# Patient Record
Sex: Female | Born: 1980 | Race: White | Hispanic: No | Marital: Single | Smoking: Current every day smoker
Health system: Southern US, Community
[De-identification: ages and names within clinical notes are randomized; demographics above are authoritative.]

## PROBLEM LIST (undated history)

## (undated) DIAGNOSIS — F410 Panic disorder [episodic paroxysmal anxiety] without agoraphobia: Secondary | ICD-10-CM

## (undated) DIAGNOSIS — F32A Depression, unspecified: Secondary | ICD-10-CM

## (undated) DIAGNOSIS — K219 Gastro-esophageal reflux disease without esophagitis: Secondary | ICD-10-CM

## (undated) DIAGNOSIS — F1120 Opioid dependence, uncomplicated: Secondary | ICD-10-CM

## (undated) DIAGNOSIS — F329 Major depressive disorder, single episode, unspecified: Secondary | ICD-10-CM

## (undated) HISTORY — PX: TONSILLECTOMY: SUR1361

## (undated) HISTORY — DX: Depression, unspecified: F32.A

## (undated) HISTORY — DX: Gastro-esophageal reflux disease without esophagitis: K21.9

## (undated) HISTORY — DX: Opioid dependence, uncomplicated: F11.20

## (undated) HISTORY — DX: Major depressive disorder, single episode, unspecified: F32.9

## (undated) HISTORY — DX: Panic disorder (episodic paroxysmal anxiety): F41.0

---

## 2008-08-07 ENCOUNTER — Emergency Department (HOSPITAL_COMMUNITY): Admission: EM | Admit: 2008-08-07 | Discharge: 2008-08-07 | Payer: Self-pay | Admitting: Emergency Medicine

## 2009-02-09 ENCOUNTER — Emergency Department (HOSPITAL_COMMUNITY): Admission: EM | Admit: 2009-02-09 | Discharge: 2009-02-09 | Payer: Self-pay | Admitting: Emergency Medicine

## 2009-07-09 ENCOUNTER — Emergency Department (HOSPITAL_COMMUNITY): Admission: EM | Admit: 2009-07-09 | Discharge: 2009-07-09 | Payer: Self-pay | Admitting: Emergency Medicine

## 2009-09-15 ENCOUNTER — Emergency Department (HOSPITAL_COMMUNITY): Admission: EM | Admit: 2009-09-15 | Discharge: 2009-09-15 | Payer: Self-pay | Admitting: Emergency Medicine

## 2009-09-16 ENCOUNTER — Ambulatory Visit (HOSPITAL_COMMUNITY): Admission: RE | Admit: 2009-09-16 | Discharge: 2009-09-16 | Payer: Self-pay | Admitting: Emergency Medicine

## 2010-02-07 ENCOUNTER — Emergency Department (HOSPITAL_COMMUNITY): Admission: EM | Admit: 2010-02-07 | Discharge: 2010-02-07 | Payer: Self-pay | Admitting: Emergency Medicine

## 2010-02-21 ENCOUNTER — Emergency Department (HOSPITAL_COMMUNITY): Admission: EM | Admit: 2010-02-21 | Discharge: 2010-02-21 | Payer: Self-pay | Admitting: Emergency Medicine

## 2010-07-21 ENCOUNTER — Emergency Department (HOSPITAL_COMMUNITY): Admission: EM | Admit: 2010-07-21 | Discharge: 2010-07-21 | Payer: Self-pay | Admitting: Emergency Medicine

## 2011-01-26 LAB — URINALYSIS, ROUTINE W REFLEX MICROSCOPIC
Nitrite: NEGATIVE
Protein, ur: NEGATIVE mg/dL

## 2011-01-26 LAB — HCG, QUANTITATIVE, PREGNANCY: hCG, Beta Chain, Quant, S: 51595 m[IU]/mL — ABNORMAL HIGH (ref <5)

## 2011-02-01 LAB — URINALYSIS, ROUTINE W REFLEX MICROSCOPIC
Glucose, UA: NEGATIVE mg/dL
Ketones, ur: NEGATIVE mg/dL
Nitrite: POSITIVE — AB
Protein, ur: NEGATIVE mg/dL
Urobilinogen, UA: 0.2 mg/dL (ref 0.0–1.0)
pH: 5.5 (ref 5.0–8.0)

## 2011-02-01 LAB — URINE CULTURE: Colony Count: >=100000

## 2011-02-01 LAB — URINE MICROSCOPIC-ADD ON

## 2011-02-01 LAB — PREGNANCY, URINE: Preg Test, Ur: NEGATIVE

## 2011-02-05 LAB — GC/CHLAMYDIA PROBE AMP, GENITAL
Chlamydia, DNA Probe: NEGATIVE
GC Probe Amp, Genital: NEGATIVE

## 2011-02-05 LAB — URINALYSIS, ROUTINE W REFLEX MICROSCOPIC
Bilirubin Urine: NEGATIVE
Ketones, ur: NEGATIVE mg/dL
Protein, ur: NEGATIVE mg/dL

## 2011-02-05 LAB — URINE MICROSCOPIC-ADD ON

## 2011-02-05 LAB — URINE CULTURE

## 2011-02-05 LAB — PREGNANCY, URINE: Preg Test, Ur: NEGATIVE

## 2011-02-05 LAB — WET PREP, GENITAL: Yeast Wet Prep HPF POC: NONE SEEN

## 2011-02-15 LAB — URINALYSIS, ROUTINE W REFLEX MICROSCOPIC
Specific Gravity, Urine: 1.015 (ref 1.005–1.030)
Urobilinogen, UA: 0.2 mg/dL (ref 0.0–1.0)
pH: 7.5 (ref 5.0–8.0)

## 2011-02-15 LAB — WET PREP, GENITAL
Clue Cells Wet Prep HPF POC: NONE SEEN
Trich, Wet Prep: NONE SEEN
Yeast Wet Prep HPF POC: NONE SEEN

## 2011-02-15 LAB — GC/CHLAMYDIA PROBE AMP, GENITAL: Chlamydia, DNA Probe: NEGATIVE

## 2011-02-18 LAB — RAPID STREP SCREEN (MED CTR MEBANE ONLY): Streptococcus, Group A Screen (Direct): NEGATIVE

## 2011-04-10 ENCOUNTER — Emergency Department (HOSPITAL_COMMUNITY): Payer: Medicaid - Out of State

## 2011-04-10 ENCOUNTER — Emergency Department (HOSPITAL_COMMUNITY)
Admission: EM | Admit: 2011-04-10 | Discharge: 2011-04-10 | Disposition: A | Discharge status: Home / Self Care | Payer: Medicaid - Out of State | Attending: Emergency Medicine | Admitting: Emergency Medicine

## 2011-04-10 DIAGNOSIS — R109 Unspecified abdominal pain: Secondary | ICD-10-CM | POA: Insufficient documentation

## 2011-04-10 DIAGNOSIS — Z87442 Personal history of urinary calculi: Secondary | ICD-10-CM | POA: Insufficient documentation

## 2011-04-10 LAB — CBC
HCT: 42.7 % (ref 36.0–46.0)
Hemoglobin: 14.9 g/dL (ref 12.0–15.0)
MCH: 31.7 pg (ref 26.0–34.0)
MCHC: 34.9 g/dL (ref 30.0–36.0)
MCV: 90.9 fL (ref 78.0–100.0)

## 2011-04-10 LAB — URINALYSIS, ROUTINE W REFLEX MICROSCOPIC
Hgb urine dipstick: NEGATIVE
pH: 5.5 (ref 5.0–8.0)

## 2011-04-10 LAB — DIFFERENTIAL
Lymphocytes Relative: 34 % (ref 12–46)
Lymphs Abs: 3.2 10*3/uL (ref 0.7–4.0)
Monocytes Absolute: 0.6 10*3/uL (ref 0.1–1.0)
Monocytes Relative: 6 % (ref 3–12)
Neutro Abs: 5.7 10*3/uL (ref 1.7–7.7)

## 2011-04-10 LAB — BASIC METABOLIC PANEL
BUN: 10 mg/dL (ref 6–23)
CO2: 24 mEq/L (ref 19–32)
Calcium: 10.5 mg/dL (ref 8.4–10.5)
Glucose, Bld: 91 mg/dL (ref 70–99)
Sodium: 136 mEq/L (ref 135–145)

## 2011-11-20 IMAGING — US US OB COMP LESS 14 WK
1 series · 13 of 28 positions shown · non-contrast
Comparison: None.

07/22/2010 - DUPLICATE COPY for exam association in [facility] – No change from original report.
CLINICAL DATA: Pregnant with abdominal pain. Rule out ectopic
 gestation

 OBSTETRIC <14 WK US AND TRANSVAGINAL OB US
TECHNIQUE: Both transabdominal and transvaginal ultrasound
 examinations were performed for complete evaluation of the
 gestation as well as the maternal uterus, adnexal regions, and
 pelvic cul-de-sac. Transvaginal technique was performed to assess
 early pregnancy.

[Series 1: us ob comp less 14 wk · 0.23mm/px · 13 of 99 slices shown]
[im 4/99]
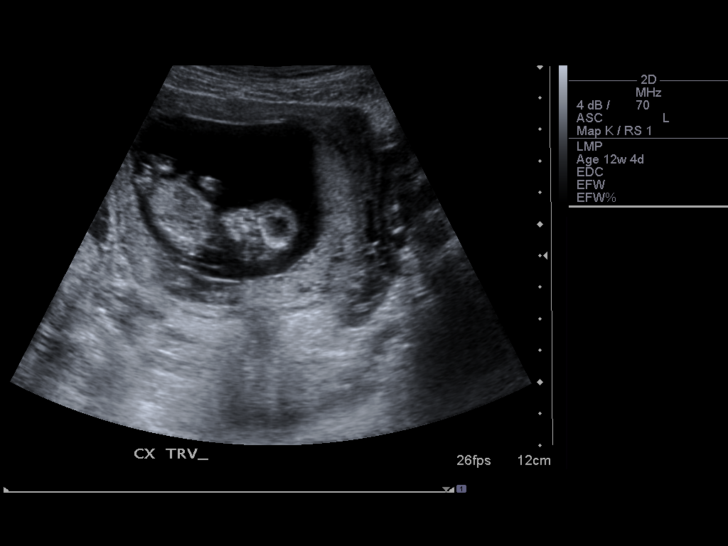
[im 11/99]
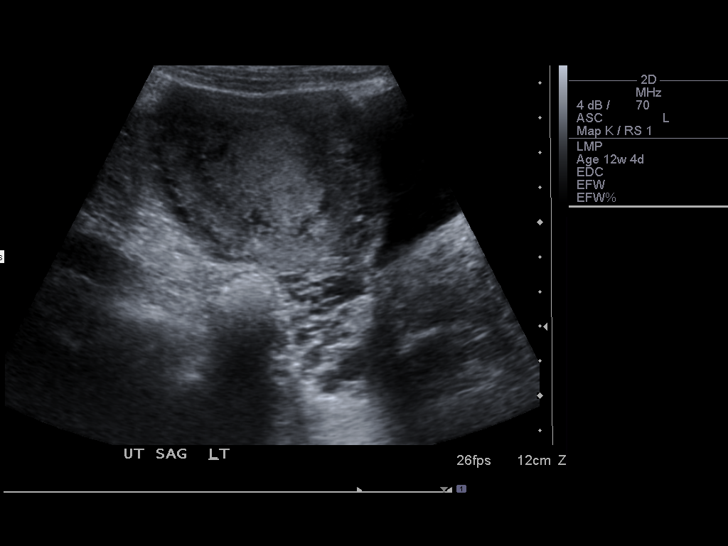
[im 19/99]
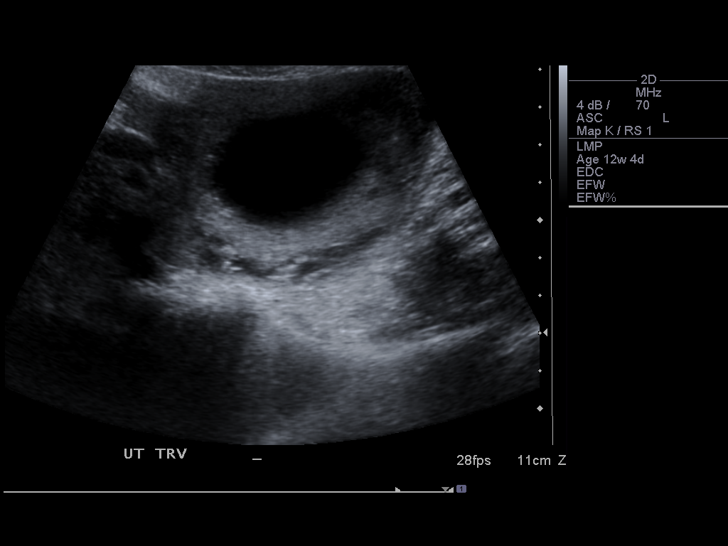
[im 26/99]
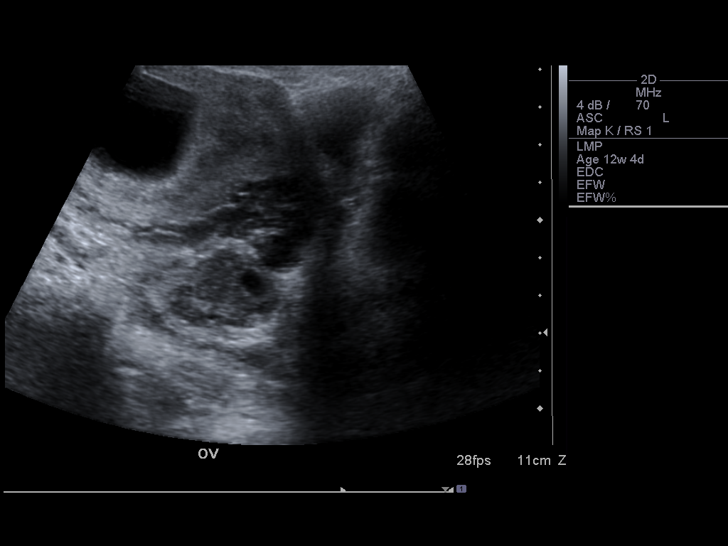
[im 33/99]
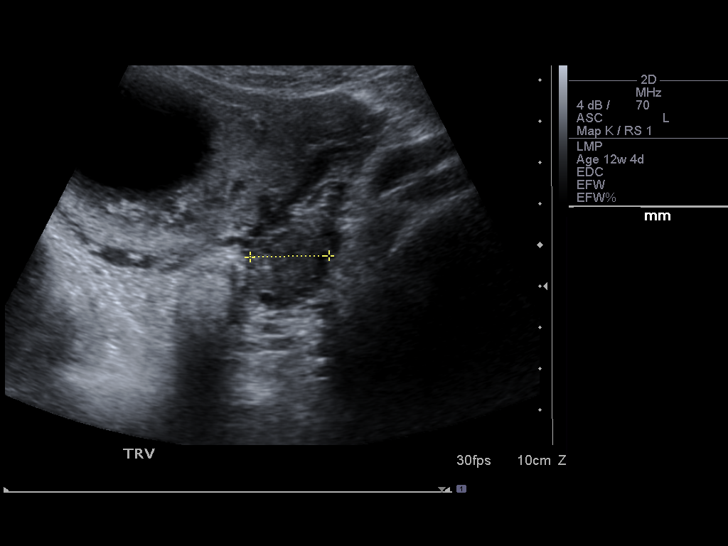
[im 40/99]
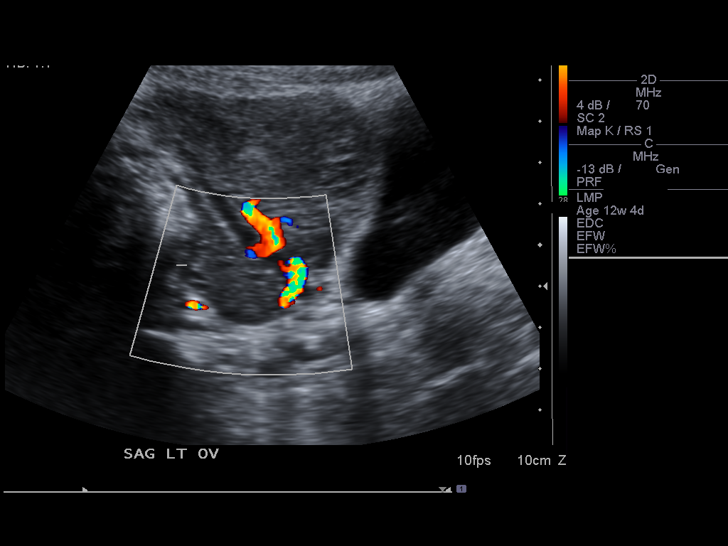
[im 51/99]
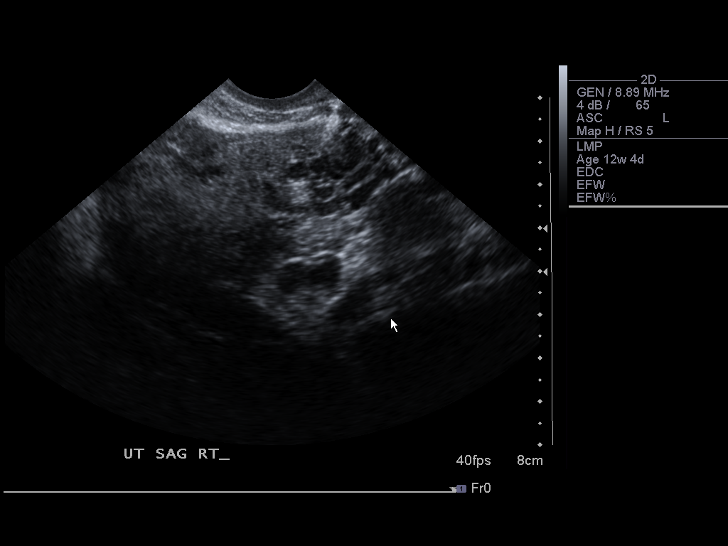
[im 59/99]
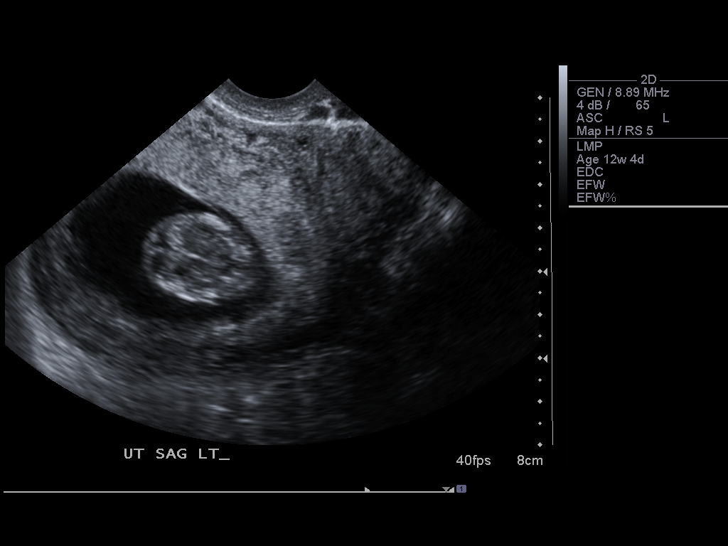
[im 66/99]
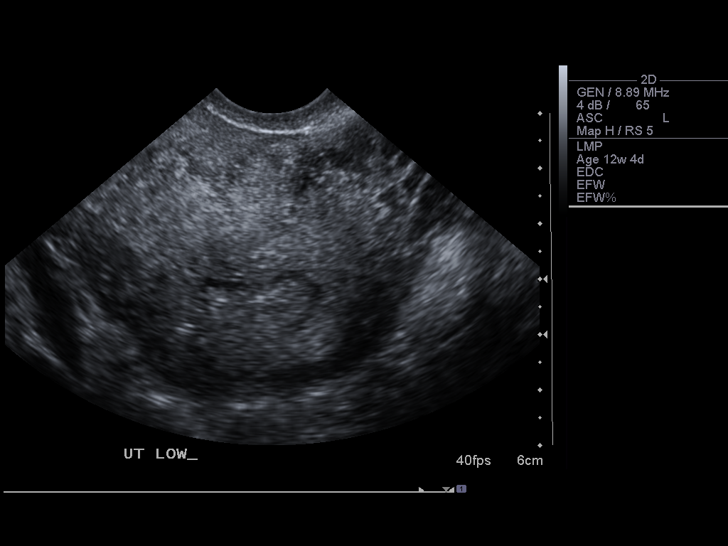
[im 73/99]
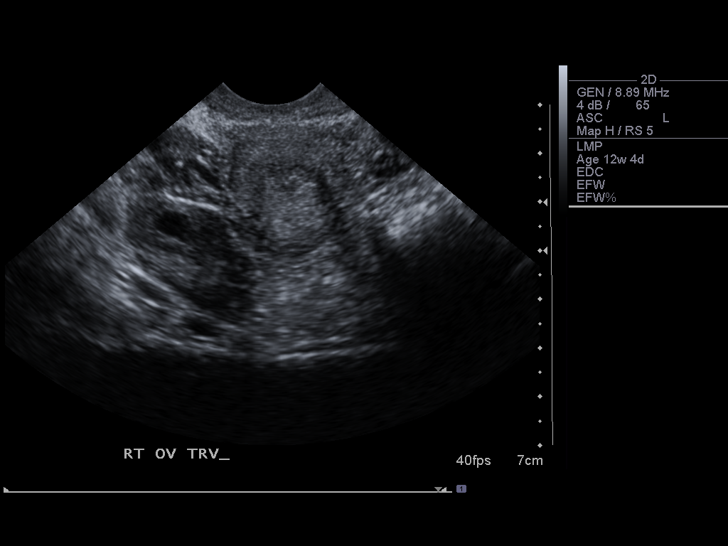
[im 80/99]
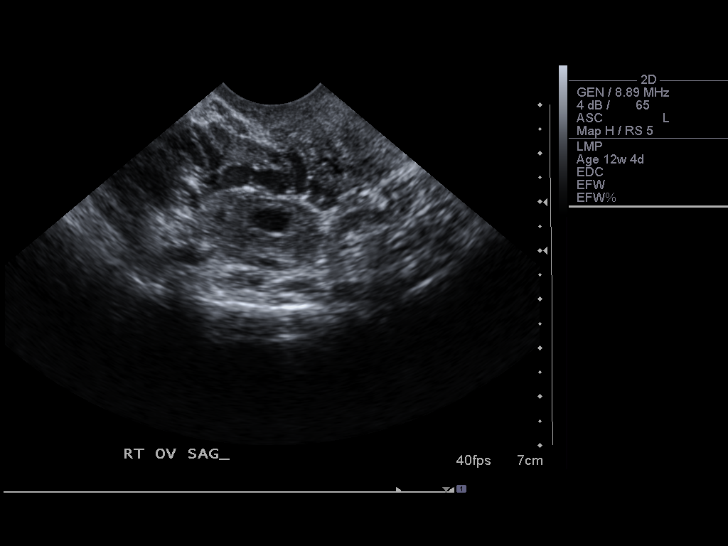
[im 88/99]
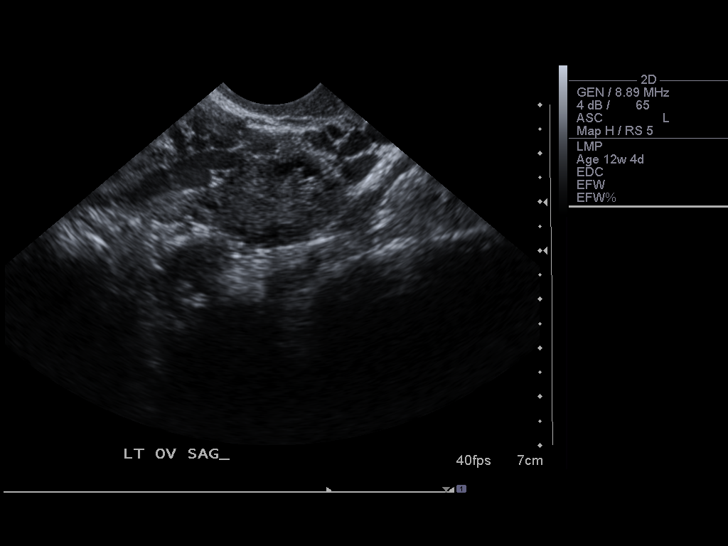
[im 95/99]
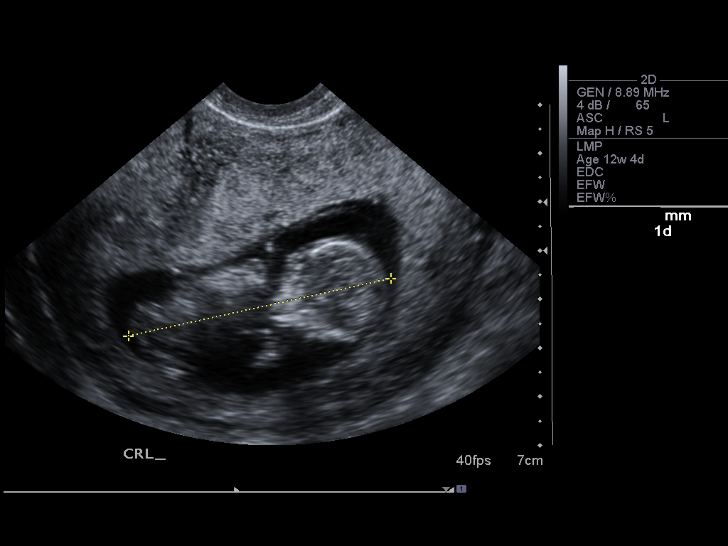

[13 of 28 positions shown; findings below may reference images not displayed]

Intrauterine gestational sac: Visualized/normal in shape.
 Yolk sac: Not seen
 Embryo: Seen
 Cardiac Activity: Seen
 Heart Rate: 169 bpm

 MSD: mm w d
 CRL: 5.5 mm 12 w 1 d US EDC: 02/02/2011

 Maternal uterus/adnexae:
 The right ovary appears normal measuring 2.2 x 1.2 1 x 2.3 cm. The
 left ovary appears normal measuring 2.1 x 2.9 x 1.6 cm. No pelvic
 fluid or separate adnexal masses are seen.

 Currently the placental implantation site completely covers the
 internal cervical os asymmetrically.
IMPRESSION: Single living intrauterine pregnancy demonstrating an estimated
 gestational age by ultrasound 12 weeks 1 day. This correlates well
 with assigned gestational age by LMP of 12 weeks 4 days and
 corresponding EDC of 01/29/2011.

 Normal ovaries.

 Current asymmetric complete coverage of the internal cervical os by
 the placental implantation site. Placental position can be
 reassessed at the time of anatomic evaluation.

## 2011-12-30 ENCOUNTER — Emergency Department (HOSPITAL_COMMUNITY)
Admission: EM | Admit: 2011-12-30 | Discharge: 2011-12-31 | Disposition: A | Discharge status: Home / Self Care | Payer: Medicaid - Out of State | Attending: Emergency Medicine | Admitting: Emergency Medicine

## 2011-12-30 ENCOUNTER — Encounter (HOSPITAL_COMMUNITY): Payer: Self-pay | Admitting: *Deleted

## 2011-12-30 DIAGNOSIS — K089 Disorder of teeth and supporting structures, unspecified: Secondary | ICD-10-CM | POA: Insufficient documentation

## 2011-12-30 DIAGNOSIS — N898 Other specified noninflammatory disorders of vagina: Secondary | ICD-10-CM | POA: Insufficient documentation

## 2011-12-30 DIAGNOSIS — K0889 Other specified disorders of teeth and supporting structures: Secondary | ICD-10-CM

## 2011-12-30 DIAGNOSIS — K1370 Unspecified lesions of oral mucosa: Secondary | ICD-10-CM | POA: Insufficient documentation

## 2011-12-30 LAB — URINALYSIS, ROUTINE W REFLEX MICROSCOPIC
Bilirubin Urine: NEGATIVE
Hgb urine dipstick: NEGATIVE
Specific Gravity, Urine: 1.02 (ref 1.005–1.030)
Urobilinogen, UA: 0.2 mg/dL (ref 0.0–1.0)

## 2011-12-30 LAB — WET PREP, GENITAL
Trich, Wet Prep: NONE SEEN
Yeast Wet Prep HPF POC: NONE SEEN

## 2011-12-30 MED ORDER — OXYCODONE-ACETAMINOPHEN 5-325 MG PO TABS
1.0000 | ORAL_TABLET | Freq: Once | ORAL | Status: AC
  Administered 2011-12-30: 1 via ORAL
  Filled 2011-12-30: qty 1

## 2011-12-30 MED ORDER — IBUPROFEN 800 MG PO TABS
800.0000 mg | ORAL_TABLET | Freq: Once | ORAL | Status: AC
  Administered 2011-12-30: 800 mg via ORAL
  Filled 2011-12-30: qty 1

## 2011-12-30 MED ORDER — PENICILLIN V POTASSIUM 500 MG PO TABS: 500.0000 mg | ORAL_TABLET | Freq: Four times a day (QID) | ORAL | Status: AC

## 2011-12-30 NOTE — ED Provider Notes (Signed)
History     CSN: [id number]  Arrival date & time 12/30/11  2209   First MD Initiated Contact with Patient 12/30/11 2253      Chief Complaint  Patient presents with  . Dental Pain  . Vaginal Discharge     Patient is a 31 y.o. female presenting with tooth pain and vaginal discharge. The history is provided by the patient.  Dental PainThe primary symptoms include mouth pain. The symptoms began 12 to 24 hours ago. The symptoms are worsening. The symptoms are new. The symptoms occur constantly.  Additional symptoms do not include: drooling.   Vaginal Discharge This is a new problem. The problem occurs hourly. The problem has not changed since onset.Pertinent negatives include no abdominal pain. The symptoms are aggravated by nothing. The symptoms are relieved by nothing.  pt denies fever/vomiting/vag bleeding   PMH - none  History reviewed. No pertinent past surgical history.  History reviewed. No pertinent family history.  History  Substance Use Topics  . Smoking status: Not on file  . Smokeless tobacco: Not on file  . Alcohol Use: Not on file    OB History    Grav Para Term Preterm Abortions TAB SAB Ect Mult Living                  Review of Systems  HENT: Negative for drooling.   Gastrointestinal: Negative for abdominal pain.  Genitourinary: Positive for vaginal discharge.    Allergies  Review of patient's allergies indicates no known allergies.  Home Medications  No current outpatient prescriptions on file.  BP 109/58  Pulse 84  Temp 98 F (36.7 C)  Resp 20  Ht 5' (1.524 m)  Wt 99 lb (44.906 kg)  BMI 19.33 kg/m2  SpO2 99%  Physical Exam CONSTITUTIONAL: Well developed/well nourished HEAD AND FACE: Normocephalic/atraumatic EYES: EOMI/PERRL ENMT: Mucous membranes moist.  No trismus.  Tender to left lower molar but no abscess noted NECK: supple no meningeal signs SPINE:entire spine nontender CV: S1/S2 noted, no murmurs/rubs/gallops noted LUNGS:  Lungs are clear to auscultation bilaterally, no apparent distress ABDOMEN: soft, nontender, no rebound or guarding GU:no cva tenderness Chaperone present - no vaginal discharge/bleeding noted, no CMT, no adnexal tenderness noted NEURO: Pt is awake/alert, moves all extremitiesx4 EXTREMITIES: pulses normal, full ROM SKIN: warm, color normal PSYCH: no abnormalities of mood noted  ED Course  Procedures   Labs Reviewed  URINALYSIS, ROUTINE W REFLEX MICROSCOPIC  PREGNANCY, URINE  GC/CHLAMYDIA PROBE AMP, GENITAL  WET PREP, GENITAL      MDM  Nursing notes reviewed and considered in documentation All labs/vitals reviewed and considered         Joya Gaskins, MD 12/31/11 0003

## 2011-12-30 NOTE — ED Notes (Addendum)
Pt c/o pain to a tooth on the left bottom side of her mouth. Pt also c/o yellow vaginal discharge and odor.

## 2011-12-30 NOTE — Discharge Instructions (Signed)
Dental Pain     Toothache is pain in or around a tooth. It may get worse with chewing or with cold or heat.   HOME CARE  · Your dentist may use a numbing medicine during treatment. If so, you may need to avoid eating until the medicine wears off. Ask your dentist about this.   · Only take medicine as told by your dentist or doctor.   · Avoid chewing food near the painful tooth until after all treatment is done. Ask your dentist about this.   GET HELP RIGHT AWAY IF:   · The problem gets worse or new problems appear.   · You have a fever.   · There is redness and puffiness (swelling) of the face, jaw, or neck.   · You cannot open your mouth.   · There is pain in the jaw.   · There is very bad pain that is not helped by medicine.   MAKE SURE YOU:   · Understand these instructions.   · Will watch your condition.   · Will get help right away if you are not doing well or get worse.   Document Released: 04/17/2008 Document Revised: 07/12/2011 Document Reviewed: 04/17/2008  [employer]® Patient Information ©2012 [employer].

## 2012-01-01 LAB — GC/CHLAMYDIA PROBE AMP, GENITAL
Chlamydia, DNA Probe: NEGATIVE
GC Probe Amp, Genital: NEGATIVE

## 2012-02-13 ENCOUNTER — Emergency Department (HOSPITAL_COMMUNITY)
Admission: EM | Admit: 2012-02-13 | Discharge: 2012-02-13 | Disposition: A | Discharge status: Home / Self Care | Payer: Self-pay | Attending: Emergency Medicine | Admitting: Emergency Medicine

## 2012-02-13 ENCOUNTER — Encounter (HOSPITAL_COMMUNITY): Payer: Self-pay | Admitting: Emergency Medicine

## 2012-02-13 DIAGNOSIS — F17200 Nicotine dependence, unspecified, uncomplicated: Secondary | ICD-10-CM | POA: Insufficient documentation

## 2012-02-13 DIAGNOSIS — J029 Acute pharyngitis, unspecified: Secondary | ICD-10-CM | POA: Insufficient documentation

## 2012-02-13 LAB — MONONUCLEOSIS SCREEN: Mono Screen: NEGATIVE

## 2012-02-13 NOTE — ED Notes (Signed)
MD at bedside to discuss plan of care

## 2012-02-13 NOTE — ED Notes (Signed)
Pt with sore throat, general body aches and ear aches since sunday

## 2012-02-13 NOTE — ED Notes (Signed)
Into room to assess patient. States she has had a sore throat and body aches x 3 days. States her throat feels like it is closing. No respiratory distress. Throat assessed; red with no blisters or drainage. Denies needs. Call bell within reach. Awaiting to be seen.

## 2012-02-13 NOTE — ED Notes (Signed)
Lab at bedside to draw blood.

## 2012-02-13 NOTE — ED Notes (Signed)
Resting sitting up in bed. No distress. Equal chest rise and fall, regular, unlabored. Call bell within reach. Denies needs.

## 2012-02-13 NOTE — Discharge Instructions (Signed)
Caitlyn Cummings, you had physical examination, a rapid strep test, and a test for infectious mononucleosis to check on you for a sore throat.  Your strep test and mono test were both negative.  This means that your sore throat is caused by a virus infection.  Virus infections do not respond to antibiotic medicine.  Treatment for viral sore throat consists of gargling with warm salt water and taking Tylenol or Motrin if needed for fever.  This illness should get better over the next three to five days.Pharyngitis, Viral and Bacterial Pharyngitis is soreness (inflammation) or infection of the pharynx. It is also called a sore throat. CAUSES  Most sore throats are caused by viruses and are part of a cold. However, some sore throats are caused by strep and other bacteria. Sore throats can also be caused by post nasal drip from draining sinuses, allergies and sometimes from sleeping with an open mouth. Infectious sore throats can be spread from person to person by coughing, sneezing and sharing cups or eating utensils. TREATMENT  Sore throats that are viral usually last 3-4 days. Viral illness will get better without medications (antibiotics). Strep throat and other bacterial infections will usually begin to get better about 24-48 hours after you begin to take antibiotics. HOME CARE INSTRUCTIONS   If the caregiver feels there is a bacterial infection or if there is a positive strep test, they will prescribe an antibiotic. The full course of antibiotics must be taken. If the full course of antibiotic is not taken, you or your child may become ill again. If you or your child has strep throat and do not finish all of the medication, serious heart or kidney diseases may develop.   Drink enough water and fluids to keep your urine clear or pale yellow.   Only take over-the-counter or prescription medicines for pain, discomfort or fever as directed by your caregiver.   Get lots of rest.   Gargle with salt water ( tsp.  of salt in a glass of water) as often as every 1-2 hours as you need for comfort.   Hard candies may soothe the throat if individual is not at risk for choking. Throat sprays or lozenges may also be used.  SEEK MEDICAL CARE IF:   Large, tender lumps in the neck develop.   A rash develops.   Green, yellow-brown or bloody sputum is coughed up.   Your baby is older than 3 months with a rectal temperature of 100.5 F (38.1 C) or higher for more than 1 day.  SEEK IMMEDIATE MEDICAL CARE IF:   A stiff neck develops.   You or your child are drooling or unable to swallow liquids.   You or your child are vomiting, unable to keep medications or liquids down.   You or your child has severe pain, unrelieved with recommended medications.   You or your child are having difficulty breathing (not due to stuffy nose).   You or your child are unable to fully open your mouth.   You or your child develop redness, swelling, or severe pain anywhere on the neck.   You have a fever.   Your baby is older than 3 months with a rectal temperature of 102 F (38.9 C) or higher.   Your baby is 89 months old or younger with a rectal temperature of 100.4 F (38 C) or higher.  MAKE SURE YOU:   Understand these instructions.   Will watch your condition.   Will get help  right away if you are not doing well or get worse.  Document Released: 10/30/2005 Document Revised: 10/19/2011 Document Reviewed: 01/27/2008 [facility] Patient Information 2012 [place], [place].

## 2012-02-13 NOTE — ED Notes (Signed)
Patient resting sitting up in bed. No distress. Denies any needs. Call bell within reach. Will continue to monitor.

## 2012-02-13 NOTE — ED Provider Notes (Signed)
History     CSN: [id number]  Arrival date & time 02/13/12  1953   First MD Initiated Contact with Patient 02/13/12 2017      Chief Complaint  Patient presents with  . Sore Throat  . Otalgia  . Generalized Body Aches    (Consider location/radiation/quality/duration/timing/severity/associated sxs/prior treatment) HPI Comments: Patient is a 31 year old woman who developed sore throat 2 days ago. She had body aches. Our ears are hurting. She had temperature to 102.7. She's been taking Tylenol and Motrin for pain and fever. She therefore seeks evaluation.  Patient is a 31 y.o. female presenting with pharyngitis and ear pain. The history is provided by the patient. No language interpreter was used.  Sore Throat This is a new problem. The current episode started 2 days ago. The problem occurs constantly. The problem has been gradually worsening. Associated symptoms comments: Bilateral earache.. The symptoms are aggravated by nothing. The symptoms are relieved by medications and acetaminophen. She has tried acetaminophen (ibuprofen) for the symptoms. Improvement on treatment: transient relief.  Otalgia Associated symptoms include sore throat.    History reviewed. No pertinent past medical history.  History reviewed. No pertinent past surgical history.  No family history on file.  History  Substance Use Topics  . Smoking status: Current Everyday Smoker -- 1.0 packs/day for 13 years    Types: Cigarettes  . Smokeless tobacco: Not on file  . Alcohol Use: No    OB History    Grav Para Term Preterm Abortions TAB SAB Ect Mult Living                  Review of Systems  Constitutional: Positive for fever and chills.  HENT: Positive for ear pain and sore throat.   Eyes: Negative.   Respiratory: Negative.   Cardiovascular: Negative.   Gastrointestinal: Negative.   Genitourinary: Negative.   Musculoskeletal: Positive for myalgias.  Skin: Negative.   Neurological: Negative.     Hematological: Positive for adenopathy.    Allergies  Review of patient's allergies indicates no known allergies.  Home Medications  No current outpatient prescriptions on file.  BP 99/57  Pulse 108  Temp(Src) 98.1 F (36.7 C) (Oral)  Resp 20  Ht 5' (1.524 m)  Wt 102 lb (46.267 kg)  BMI 19.92 kg/m2  SpO2 100%  LMP 02/13/2012  Physical Exam  Nursing note and vitals reviewed. Constitutional: She is oriented to person, place, and time. She appears well-developed and well-nourished. No distress.       Young woman with a squeaky voice.  HENT:  Head: Normocephalic and atraumatic.  Right Ear: External ear normal.  Left Ear: External ear normal.       Throat is red, but without exudate.  Eyes: Conjunctivae and EOM are normal. Pupils are equal, round, and reactive to light.  Neck: Normal range of motion.  Cardiovascular: Normal rate, regular rhythm and normal heart sounds.   Pulmonary/Chest: Effort normal and breath sounds normal.  Abdominal: Soft.  Musculoskeletal: Normal range of motion.  Lymphadenopathy:    Cervical adenopathy: prominent right cervical adenopathy.  Neurological: She is alert and oriented to person, place, and time.       No sensory or motor deficit  Skin: Skin is warm and dry. No rash noted.  Psychiatric: She has a normal mood and affect. Her behavior is normal.    ED Course  Procedures (including critical care time)   Labs Reviewed  RAPID STREP SCREEN  MONONUCLEOSIS SCREEN   8:39 PM  Patient was seen and had physical examination. Rapid strep test and infectious mono screening test were ordered.  9:50 PM Rapid strep and mono test were both negative.  Advised symptomatic treatment with Tylenol, saline gargles.  1. Viral pharyngitis         Carleene Cooper III, MD 02/13/12 2155

## 2012-02-13 NOTE — ED Notes (Signed)
MD at bedside to evaluate.

## 2015-04-12 ENCOUNTER — Encounter (HOSPITAL_COMMUNITY): Payer: Self-pay | Admitting: Emergency Medicine

## 2015-04-12 ENCOUNTER — Emergency Department (HOSPITAL_COMMUNITY)
Admission: EM | Admit: 2015-04-12 | Discharge: 2015-04-12 | Disposition: A | Discharge status: Home / Self Care | Payer: Medicaid - Out of State | Attending: Emergency Medicine | Admitting: Emergency Medicine

## 2015-04-12 DIAGNOSIS — Z8742 Personal history of other diseases of the female genital tract: Secondary | ICD-10-CM | POA: Insufficient documentation

## 2015-04-12 DIAGNOSIS — Z720 Tobacco use: Secondary | ICD-10-CM | POA: Diagnosis not present

## 2015-04-12 DIAGNOSIS — Z87440 Personal history of urinary (tract) infections: Secondary | ICD-10-CM | POA: Diagnosis not present

## 2015-04-12 DIAGNOSIS — Z792 Long term (current) use of antibiotics: Secondary | ICD-10-CM | POA: Diagnosis not present

## 2015-04-12 DIAGNOSIS — R1030 Lower abdominal pain, unspecified: Secondary | ICD-10-CM

## 2015-04-12 DIAGNOSIS — A599 Trichomoniasis, unspecified: Secondary | ICD-10-CM | POA: Insufficient documentation

## 2015-04-12 DIAGNOSIS — R1032 Left lower quadrant pain: Secondary | ICD-10-CM | POA: Diagnosis present

## 2015-04-12 LAB — URINALYSIS, ROUTINE W REFLEX MICROSCOPIC
Bilirubin Urine: NEGATIVE
GLUCOSE, UA: NEGATIVE mg/dL
Ketones, ur: NEGATIVE mg/dL
NITRITE: NEGATIVE
PH: 5.5 (ref 5.0–8.0)
Protein, ur: NEGATIVE mg/dL
Urobilinogen, UA: 0.2 mg/dL (ref 0.0–1.0)

## 2015-04-12 LAB — URINE MICROSCOPIC-ADD ON

## 2015-04-12 MED ORDER — NAPROXEN 500 MG PO TABS: 500.0000 mg | ORAL_TABLET | Freq: Two times a day (BID) | ORAL | Status: DC

## 2015-04-12 MED ORDER — KETOROLAC TROMETHAMINE 60 MG/2ML IM SOLN
INTRAMUSCULAR | Status: AC
  Administered 2015-04-12: 60 mg via INTRAMUSCULAR
  Filled 2015-04-12: qty 2

## 2015-04-12 MED ORDER — METRONIDAZOLE 500 MG PO TABS
2000.0000 mg | ORAL_TABLET | Freq: Once | ORAL | Status: AC
  Administered 2015-04-12: 2000 mg via ORAL
  Filled 2015-04-12: qty 4

## 2015-04-12 MED ORDER — KETOROLAC TROMETHAMINE 60 MG/2ML IM SOLN
60.0000 mg | Freq: Once | INTRAMUSCULAR | Status: AC
  Administered 2015-04-12: 60 mg via INTRAMUSCULAR

## 2015-04-12 NOTE — ED Provider Notes (Signed)
CSN: [id number]     Arrival date & time 04/12/15  [id number] History   First MD Initiated Contact with Patient 04/12/15 1451     Chief Complaint  Patient presents with  . Abdominal Pain     (Consider location/radiation/quality/duration/timing/severity/associated sxs/prior Treatment) HPI Comments: 34 y/o female - has had pain for 10 days in the lower abdomen - this is located in the LLQ - it is constant but varies in intensity, it radiates to the lower back occasionallyi - no dysuria, no hematuria and no vaginal discharge or bleeding.  She had an abortion 6 weeks ago - had no pain and no bleeding afterwards - 10 days after procedure had well check at the health dept and after pelvic exam she was told that everything was normal - she has had no new partners in 3 months and no sexuarl activity during same time frame - then 2 weeks after that developed some pain and was seen in the ED in [place] - describes having a pelvic exam and an US and told she had a UTI and an ovarian cyst.  This pain was treated with Keflex and Hydrocodone - improved temporarily but still having daily pain now 1 week later.  She has no f/c/n/v and no diarrhea.    Patient is a 34 y.o. female presenting with abdominal pain. The history is provided by the patient.  Abdominal Pain   History reviewed. No pertinent past medical history. Past Surgical History  Procedure Laterality Date  . Tonsillectomy     History reviewed. No pertinent family history. History  Substance Use Topics  . Smoking status: Current Every Day Smoker -- 1.00 packs/day for 13 years    Types: Cigarettes  . Smokeless tobacco: Not on file  . Alcohol Use: No   OB History    Gravida Para Term Preterm AB TAB SAB Ectopic Multiple Living   4 3   1   0   3     Review of Systems  Gastrointestinal: Positive for abdominal pain.  All other systems reviewed and are negative.     Allergies  Percocet  Home Medications   Prior to Admission medications    Medication Sig Start Date End Date Taking? Authorizing Provider  cephALEXin (KEFLEX) 500 MG capsule Take 500 mg by mouth 3 (three) times daily. 7 DAY COURSE FILLED 04/05/15   Yes Historical Provider, MD  naproxen (NAPROSYN) 500 MG tablet Take 1 tablet (500 mg total) by mouth 2 (two) times daily with a meal. 04/12/15   Eber HongBrian Korry Dalgleish, MD   BP 109/57 mmHg  Pulse 69  Temp(Src) 98.1 F (36.7 C)  Resp 16  Ht 4\' 11"  (1.499 m)  Wt 115 lb 1.6 oz (52.209 kg)  BMI 23.23 kg/m2  SpO2 98%  LMP 01/11/2015 Physical Exam  Constitutional: She appears well-developed and well-nourished. No distress.  HENT:  Head: Normocephalic and atraumatic.  Mouth/Throat: Oropharynx is clear and moist. No oropharyngeal exudate.  Eyes: Conjunctivae and EOM are normal. Pupils are equal, round, and reactive to light. Right eye exhibits no discharge. Left eye exhibits no discharge. No scleral icterus.  Neck: Normal range of motion. Neck supple. No JVD present. No thyromegaly present.  Cardiovascular: Normal rate, regular rhythm, normal heart sounds and intact distal pulses.  Exam reveals no gallop and no friction rub.   No murmur heard. Pulmonary/Chest: Effort normal and breath sounds normal. No respiratory distress. She has no wheezes. She has no rales.  Abdominal: Soft. Bowel sounds are normal. She  exhibits no distension and no mass. There is tenderness (LLQ ttp, soft - no other ttp. ).  Musculoskeletal: Normal range of motion. She exhibits no edema or tenderness.  Lymphadenopathy:    She has no cervical adenopathy.  Neurological: She is alert. Coordination normal.  Skin: Skin is warm and dry. No rash noted. No erythema.  Psychiatric: She has a normal mood and affect. Her behavior is normal.  Nursing note and vitals reviewed.   ED Course  Procedures (including critical care time) Labs Review Labs Reviewed  URINALYSIS, ROUTINE W REFLEX MICROSCOPIC (NOT AT [facility]) - Abnormal; Notable for the following:    APPearance  HAZY (*)    Specific Gravity, Urine <1.005 (*)    Hgb urine dipstick TRACE (*)    Leukocytes, UA MODERATE (*)    All other components within normal limits  URINE MICROSCOPIC-ADD ON - Abnormal; Notable for the following:    Squamous Epithelial / LPF MANY (*)    Bacteria, UA FEW (*)    All other components within normal limits    Imaging Review No results found.    MDM   Final diagnoses:  Trichomoniasis  Lower abdominal pain    Benign appearance - VS without acute findings - will obtain records to limit repeat studies but does need repeat Urinalysis.    Filed Vitals:   04/12/15 1449  BP: 103/59  Pulse: 88  Temp: 98.6 F (37 C)  Resp: 18     Labs show trich, no other findings - UA improving, VS normal, stable for d/c.  Pt informed and expresses understanding.  Eber Hong, MD 04/12/15 [phone]

## 2015-04-12 NOTE — ED Notes (Addendum)
Pt states that [facility] a few weeks ago and dx with pelvic infection and UTI.  States that her pain is no better after finishing Keflex.  Had elective abortion in April for 10wk pregnancy.

## 2015-04-12 NOTE — ED Notes (Signed)
Patient with no complaints at this time. Respirations even and unlabored. Skin warm/dry. Discharge instructions reviewed with patient at this time. Patient given opportunity to voice concerns/ask questions. Patient discharged at this time and left Emergency Department with steady gait.   

## 2015-04-12 NOTE — Discharge Instructions (Signed)
You have tested positive for Trichomonas - this is an STD  - please see reading below.  Pelvic Infection  If you have been diagnosed with a pelvic infection such as a sexually transmitted disease, you will need to be treated with antibiotics. Please take the medicines as prescribed. Some of these tests do not come back for 1-2 days in which case if they turn positive you will receive a phone call to let you know. If you are contacted and do have an infection consistent with a sexually transmitted disease, then you will need to tell any and all sexual partners that you have had in the last 6 months no so that they can be tested and treated as well. If you should develop severe or worsening pain in your abdomen or the pelvis or develop severe fevers,nausea or vomiting that prevent you from taking your medications, return to the emergency department immediately. Otherwise contact your local physician or county health department for a follow up appointment to complete STD testing including HIV and syphilis.  See the list of phone numbers below.  RESOURCE GUIDE  Dental Problems  Patients with Medicaid: [facility] [phone] [address]                                           [phone] [address] Phone:  [phone]                                                  Phone:  [phone]  If unable to pay or uninsured, contact:  [facility] or [facility]. to become qualified for the adult dental clinic.  Chronic Pain Problems Contact [facility]  [phone] Patients need to be referred by their primary care doctor.  Insufficient Money for Medicine Contact [employer]:  call "211" or [facility] 916-788-0353.  No Primary Care Doctor Call [facility]  [phone] Other agencies that provide inexpensive medical care    [facility]  [phone]    [facility]  [phone]    [facility]  [phone]    [facility]  [phone]    [facility]  [phone]    [facility]  [phone]  Psychological Services [facility] Behavioral Health  [phone] [facility]  [phone] [facility] Mental Health   [phone] (emergency services [phone])  Substance Abuse Resources Alcohol and Drug Services  [phone] [facility] 902-188-3535 [facility] [place] [phone] Floydene Flock [phone] Residential & Outpatient Substance Abuse Program  [phone]  Abuse/Neglect [facility] Child Abuse Hotline 4305025359 [facility] Child Abuse Hotline 850-511-9231 (After Hours)  Emergency Shelter [facility] (856)[phone]  Maternity Homes Room at the [place] [phone] [facility] 5306183344  MRSA Hotline #:   (418) 700-3281    [facility] [place]  [facility] [address] [place]                       [address]      [address]  [place]                                                [place]                            [place] Phone:  [phone]                                   Phone:  [phone]                 Phone:  [phone]  [facility] Phone:  [phone]  [facility] Child Abuse Hotline (979)278-2443(336) [phone] [phone](336) [phone] (After Hours)

## 2016-07-06 ENCOUNTER — Emergency Department (HOSPITAL_COMMUNITY)
Admission: EM | Admit: 2016-07-06 | Discharge: 2016-07-06 | Disposition: A | Discharge status: Home / Self Care | Payer: Medicaid Other | Attending: Emergency Medicine | Admitting: Emergency Medicine

## 2016-07-06 ENCOUNTER — Encounter (HOSPITAL_COMMUNITY): Payer: Self-pay | Admitting: Emergency Medicine

## 2016-07-06 ENCOUNTER — Emergency Department (HOSPITAL_COMMUNITY): Payer: Medicaid Other

## 2016-07-06 DIAGNOSIS — R079 Chest pain, unspecified: Secondary | ICD-10-CM | POA: Insufficient documentation

## 2016-07-06 DIAGNOSIS — B9689 Other specified bacterial agents as the cause of diseases classified elsewhere: Secondary | ICD-10-CM

## 2016-07-06 DIAGNOSIS — F17210 Nicotine dependence, cigarettes, uncomplicated: Secondary | ICD-10-CM | POA: Insufficient documentation

## 2016-07-06 DIAGNOSIS — R1010 Upper abdominal pain, unspecified: Secondary | ICD-10-CM | POA: Diagnosis present

## 2016-07-06 DIAGNOSIS — N760 Acute vaginitis: Secondary | ICD-10-CM | POA: Diagnosis not present

## 2016-07-06 LAB — CBC
HCT: 39.5 % (ref 36.0–46.0)
Hemoglobin: 13.8 g/dL (ref 12.0–15.0)
MCH: 31.4 pg (ref 26.0–34.0)
MCHC: 34.9 g/dL (ref 30.0–36.0)
MCV: 89.8 fL (ref 78.0–100.0)
Platelets: 320 10*3/uL (ref 150–400)
RBC: 4.4 MIL/uL (ref 3.87–5.11)
RDW: 13.5 % (ref 11.5–15.5)
WBC: 10.5 10*3/uL (ref 4.0–10.5)

## 2016-07-06 LAB — COMPREHENSIVE METABOLIC PANEL
ALT: 15 U/L (ref 14–54)
AST: 20 U/L (ref 15–41)
Albumin: 4.6 g/dL (ref 3.5–5.0)
Alkaline Phosphatase: 73 U/L (ref 38–126)
Anion gap: 8 (ref 5–15)
BUN: 9 mg/dL (ref 6–20)
CO2: 25 mmol/L (ref 22–32)
Calcium: 9.1 mg/dL (ref 8.9–10.3)
Chloride: 104 mmol/L (ref 101–111)
Creatinine, Ser: 0.51 mg/dL (ref 0.44–1.00)
GFR calc Af Amer: >60 mL/min (ref >60)
GFR calc non Af Amer: >60 mL/min (ref >60)
Glucose, Bld: 89 mg/dL (ref 65–99)
Potassium: 3.6 mmol/L (ref 3.5–5.1)
Sodium: 137 mmol/L (ref 135–145)
Total Bilirubin: 0.4 mg/dL (ref 0.3–1.2)
Total Protein: 7.3 g/dL (ref 6.5–8.1)

## 2016-07-06 LAB — URINALYSIS, ROUTINE W REFLEX MICROSCOPIC
Bilirubin Urine: NEGATIVE
Glucose, UA: NEGATIVE mg/dL
Hgb urine dipstick: NEGATIVE
Leukocytes, UA: NEGATIVE
Nitrite: NEGATIVE
Protein, ur: NEGATIVE mg/dL
Specific Gravity, Urine: 1.01 (ref 1.005–1.030)
pH: 5.5 (ref 5.0–8.0)

## 2016-07-06 LAB — LIPASE, BLOOD: Lipase: 31 U/L (ref 11–51)

## 2016-07-06 LAB — PREGNANCY, URINE: Preg Test, Ur: NEGATIVE

## 2016-07-06 MED ORDER — FAMOTIDINE 20 MG PO TABS: 20.0000 mg | ORAL_TABLET | Freq: Two times a day (BID) | ORAL | 0 refills | Status: DC

## 2016-07-06 MED ORDER — METRONIDAZOLE 500 MG PO TABS: 500.0000 mg | ORAL_TABLET | Freq: Two times a day (BID) | ORAL | 0 refills | Status: DC

## 2016-07-06 NOTE — ED Triage Notes (Signed)
Pt states she is having pain under her breasts with generalized abdominal pain that radiates to back. C/o nausea as well.

## 2016-07-20 NOTE — ED Provider Notes (Signed)
[facility]-EMERGENCY DEPT Provider Note   CSN: [id number] Arrival date & time: 07/06/16  1931     History   Chief Complaint Chief Complaint  Patient presents with  . Abdominal Pain    HPI Caitlyn Cummings is a 35 y.o. female.  HPI   35 year old female chest pain. Pain is in her bilateral breast. Radiates to her back. Gradual onset about 2 days ago. Waxes and wanes. No appreciable exacerbating relieving factors. No acute respiratory complaints. She is having some upper abdominal pain as well. Mild nausea. No vomiting. No fevers or chills. No acute urinary complaints. She is having some unusual vaginal discharge. No pelvic pain.  History reviewed. No pertinent past medical history.  There are no active problems to display for this patient.   Past Surgical History:  Procedure Laterality Date  . TONSILLECTOMY      OB History    Gravida Para Term Preterm AB Living   4 3     1 3    SAB TAB Ectopic Multiple Live Births   0               Home Medications    Prior to Admission medications   Medication Sig Start Date End Date Taking? Authorizing Provider  cephALEXin (KEFLEX) 500 MG capsule Take 500 mg by mouth 3 (three) times daily. 7 DAY COURSE FILLED 04/05/15    Historical Provider, MD  famotidine (PEPCID) 20 MG tablet Take 1 tablet (20 mg total) by mouth 2 (two) times daily. 07/06/16   Raeford RazorStephen Mihail Prettyman, MD  metroNIDAZOLE (FLAGYL) 500 MG tablet Take 1 tablet (500 mg total) by mouth 2 (two) times daily. 07/06/16   Raeford RazorStephen Meleana Commerford, MD  naproxen (NAPROSYN) 500 MG tablet Take 1 tablet (500 mg total) by mouth 2 (two) times daily with a meal. 04/12/15   Eber HongBrian Miller, MD    Family History No family history on file.  Social History Social History  Substance Use Topics  . Smoking status: Current Every Day Smoker    Packs/day: 1.00    Years: 13.00    Types: Cigarettes  . Smokeless tobacco: Never Used  . Alcohol use No     Allergies   Percocet [oxycodone-acetaminophen]   Review of  Systems Review of Systems  All systems reviewed and negative, other than as noted in HPI.   Physical Exam Updated Vital Signs BP 107/64 (BP Location: Right Arm)   Pulse 78   Temp 98.2 F (36.8 C) (Oral)   Resp 14   Ht 4\' 11"  (1.499 m)   Wt 100 lb (45.4 kg)   LMP 06/22/2016   SpO2 98%   BMI 20.20 kg/m   Physical Exam  Constitutional: She appears well-developed and well-nourished. No distress.  HENT:  Head: Normocephalic and atraumatic.  Eyes: Conjunctivae are normal.  Neck: Neck supple.  Cardiovascular: Normal rate and regular rhythm.   No murmur heard. Pulmonary/Chest: Effort normal and breath sounds normal. No respiratory distress.  Abdominal: Soft. There is tenderness.  Very mild epigastric tenderness without rebound or guarding. No distention.  Musculoskeletal: She exhibits no edema.  Neurological: She is alert.  Skin: Skin is warm and dry.  Psychiatric: She has a normal mood and affect.  Nursing note and vitals reviewed.    ED Treatments / Results  Labs (all labs ordered are listed, but only abnormal results are displayed) Labs Reviewed  URINALYSIS, ROUTINE W REFLEX MICROSCOPIC (NOT AT [facility]) - Abnormal; Notable for the following:       Result  Value   Ketones, ur TRACE (*)    All other components within normal limits  LIPASE, BLOOD  COMPREHENSIVE METABOLIC PANEL  CBC  PREGNANCY, URINE    EKG  EKG Interpretation  Date/Time:  Thursday July 06 2016 20:55:04 EDT Ventricular Rate:  64 PR Interval:    QRS Duration: 95 QT Interval:  459 QTC Calculation: 474 R Axis:   59 Text Interpretation:  Sinus rhythm Low voltage, precordial leads Borderline T wave abnormalities Baseline wander in lead(s) V2 Confirmed by Juleen China  MD, Oona Trammel ([id number]) on 07/06/2016 9:12:44 PM       Radiology No results found.  Procedures Procedures (including critical care time)  Medications Ordered in ED Medications - No data to display   Initial Impression / Assessment and  Plan / ED Course  I have reviewed the triage vital signs and the nursing notes.  Pertinent labs & imaging results that were available during my care of the patient were reviewed by me and considered in my medical decision making (see chart for details).  Clinical Course    35 year old female with lower chest/upper abdominal pain..Emergent process. Atypical for ACS. Doubt PE, dissection or other emergent process. She is additionally concerned that she may have bacterial vaginosis because of some vaginal discharge. She has a past history of this. She is declining a pelvic exam and requesting empiric treatment. Advised her that may potentially be missing another diagnosis without pelvic examination. Additionally, she may be getting antibiotics that she does not actually need. She understands these risks.   Final Clinical Impressions(s) / ED Diagnoses   Final diagnoses:  Chest pain, unspecified chest pain type  Bacterial vaginosis    New Prescriptions Discharge Medication List as of 07/06/2016  9:28 PM    START taking these medications   Details  famotidine (PEPCID) 20 MG tablet Take 1 tablet (20 mg total) by mouth 2 (two) times daily., Starting Thu 07/06/2016, Print    metroNIDAZOLE (FLAGYL) 500 MG tablet Take 1 tablet (500 mg total) by mouth 2 (two) times daily., Starting Thu 07/06/2016, Print         Raeford Razor, MD 07/22/16 [phone]

## 2016-07-25 ENCOUNTER — Encounter (HOSPITAL_COMMUNITY): Payer: Self-pay | Admitting: Emergency Medicine

## 2016-07-25 ENCOUNTER — Emergency Department (HOSPITAL_COMMUNITY)
Admission: EM | Admit: 2016-07-25 | Discharge: 2016-07-25 | Disposition: A | Discharge status: Home / Self Care | Payer: Medicaid Other | Attending: Dermatology | Admitting: Dermatology

## 2016-07-25 DIAGNOSIS — Z79899 Other long term (current) drug therapy: Secondary | ICD-10-CM | POA: Insufficient documentation

## 2016-07-25 DIAGNOSIS — F17210 Nicotine dependence, cigarettes, uncomplicated: Secondary | ICD-10-CM | POA: Insufficient documentation

## 2016-07-25 DIAGNOSIS — K219 Gastro-esophageal reflux disease without esophagitis: Secondary | ICD-10-CM | POA: Insufficient documentation

## 2016-07-25 NOTE — ED Triage Notes (Signed)
Pt c/o acid reflux x 2 weeks with no relief from otc medications. Pt also c/o feeling "like something is stuck in my throat and moving up and down".

## 2016-07-25 NOTE — ED Notes (Signed)
Per triage Nurse pt was walking out, states she did not want to wait

## 2016-08-03 ENCOUNTER — Encounter: Payer: Self-pay | Admitting: Adult Health

## 2016-08-15 ENCOUNTER — Encounter: Payer: Self-pay | Admitting: Adult Health

## 2016-08-15 ENCOUNTER — Ambulatory Visit (INDEPENDENT_AMBULATORY_CARE_PROVIDER_SITE_OTHER): Payer: Medicaid Other | Admitting: Adult Health

## 2016-08-15 VITALS — BP 92/52 | HR 68 | Ht 59.0 in | Wt 104.0 lb

## 2016-08-15 DIAGNOSIS — Z3201 Encounter for pregnancy test, result positive: Secondary | ICD-10-CM

## 2016-08-15 DIAGNOSIS — F1121 Opioid dependence, in remission: Secondary | ICD-10-CM | POA: Diagnosis not present

## 2016-08-15 DIAGNOSIS — Z3490 Encounter for supervision of normal pregnancy, unspecified, unspecified trimester: Secondary | ICD-10-CM

## 2016-08-15 DIAGNOSIS — R110 Nausea: Secondary | ICD-10-CM | POA: Diagnosis not present

## 2016-08-15 DIAGNOSIS — F3289 Other specified depressive episodes: Secondary | ICD-10-CM | POA: Diagnosis not present

## 2016-08-15 DIAGNOSIS — N926 Irregular menstruation, unspecified: Secondary | ICD-10-CM | POA: Diagnosis not present

## 2016-08-15 DIAGNOSIS — O3680X0 Pregnancy with inconclusive fetal viability, not applicable or unspecified: Secondary | ICD-10-CM

## 2016-08-15 LAB — POCT URINE PREGNANCY: PREG TEST UR: POSITIVE — AB

## 2016-08-15 MED ORDER — ESCITALOPRAM OXALATE 10 MG PO TABS: 10.0000 mg | ORAL_TABLET | Freq: Every day | ORAL | 6 refills | Status: DC

## 2016-08-15 MED ORDER — PRENATAL PLUS 27-1 MG PO TABS: 1.0000 | ORAL_TABLET | Freq: Every day | ORAL | 11 refills | Status: DC

## 2016-08-15 MED ORDER — ONDANSETRON 4 MG PO TBDP: 4.0000 mg | ORAL_TABLET | Freq: Three times a day (TID) | ORAL | 1 refills | Status: DC | PRN

## 2016-08-15 NOTE — Patient Instructions (Signed)
First Trimester of Pregnancy The first trimester of pregnancy is from week 1 until the end of week 12 (months 1 through 3). A week after a sperm fertilizes an egg, the egg will implant on the wall of the uterus. This embryo will begin to develop into a baby. Genes from you and your partner are forming the baby. The female genes determine whether the baby is a boy or a girl. At 6-8 weeks, the eyes and face are formed, and the heartbeat can be seen on ultrasound. At the end of 12 weeks, all the baby's organs are formed.  Now that you are pregnant, you will want to do everything you can to have a healthy baby. Two of the most important things are to get good prenatal care and to follow your health care provider's instructions. Prenatal care is all the medical care you receive before the baby's birth. This care will help prevent, find, and treat any problems during the pregnancy and childbirth. BODY CHANGES Your body goes through many changes during pregnancy. The changes vary from woman to woman.   You may gain or lose a couple of pounds at first.  You may feel sick to your stomach (nauseous) and throw up (vomit). If the vomiting is uncontrollable, call your health care provider.  You may tire easily.  You may develop headaches that can be relieved by medicines approved by your health care provider.  You may urinate more often. Painful urination may mean you have a bladder infection.  You may develop heartburn as a result of your pregnancy.  You may develop constipation because certain hormones are causing the muscles that push waste through your intestines to slow down.  You may develop hemorrhoids or swollen, bulging veins (varicose veins).  Your breasts may begin to grow larger and become tender. Your nipples may stick out more, and the tissue that surrounds them (areola) may become darker.  Your gums may bleed and may be sensitive to brushing and flossing.  Dark spots or blotches (chloasma,  mask of pregnancy) may develop on your face. This will likely fade after the baby is born.  Your menstrual periods will stop.  You may have a loss of appetite.  You may develop cravings for certain kinds of food.  You may have changes in your emotions from day to day, such as being excited to be pregnant or being concerned that something may go wrong with the pregnancy and baby.  You may have more vivid and strange dreams.  You may have changes in your hair. These can include thickening of your hair, rapid growth, and changes in texture. Some women also have hair loss during or after pregnancy, or hair that feels dry or thin. Your hair will most likely return to normal after your baby is born. WHAT TO EXPECT AT YOUR PRENATAL VISITS During a routine prenatal visit:  You will be weighed to make sure you and the baby are growing normally.  Your blood pressure will be taken.  Your abdomen will be measured to track your baby's growth.  The fetal heartbeat will be listened to starting around week 10 or 12 of your pregnancy.  Test results from any previous visits will be discussed. Your health care provider may ask you:  How you are feeling.  If you are feeling the baby move.  If you have had any abnormal symptoms, such as leaking fluid, bleeding, severe headaches, or abdominal cramping.  If you are using any tobacco products,   including cigarettes, chewing tobacco, and electronic cigarettes.  If you have any questions. Other tests that may be performed during your first trimester include:  Blood tests to find your blood type and to check for the presence of any previous infections. They will also be used to check for low iron levels (anemia) and Rh antibodies. Later in the pregnancy, blood tests for diabetes will be done along with other tests if problems develop.  Urine tests to check for infections, diabetes, or protein in the urine.  An ultrasound to confirm the proper growth  and development of the baby.  An amniocentesis to check for possible genetic problems.  Fetal screens for spina bifida and Down syndrome.  You may need other tests to make sure you and the baby are doing well.  HIV (human immunodeficiency virus) testing. Routine prenatal testing includes screening for HIV, unless you choose not to have this test. HOME CARE INSTRUCTIONS  Medicines  Follow your health care provider's instructions regarding medicine use. Specific medicines may be either safe or unsafe to take during pregnancy.  Take your prenatal vitamins as directed.  If you develop constipation, try taking a stool softener if your health care provider approves. Diet  Eat regular, well-balanced meals. Choose a variety of foods, such as meat or vegetable-based protein, fish, milk and low-fat dairy products, vegetables, fruits, and whole grain breads and cereals. Your health care provider will help you determine the amount of weight gain that is right for you.  Avoid raw meat and uncooked cheese. These carry germs that can cause birth defects in the baby.  Eating four or five small meals rather than three large meals a day may help relieve nausea and vomiting. If you start to feel nauseous, eating a few soda crackers can be helpful. Drinking liquids between meals instead of during meals also seems to help nausea and vomiting.  If you develop constipation, eat more high-fiber foods, such as fresh vegetables or fruit and whole grains. Drink enough fluids to keep your urine clear or pale yellow. Activity and Exercise  Exercise only as directed by your health care provider. Exercising will help you:  Control your weight.  Stay in shape.  Be prepared for labor and delivery.  Experiencing pain or cramping in the lower abdomen or low back is a good sign that you should stop exercising. Check with your health care provider before continuing normal exercises.  Try to avoid standing for long  periods of time. Move your legs often if you must stand in one place for a long time.  Avoid heavy lifting.  Wear low-heeled shoes, and practice good posture.  You may continue to have sex unless your health care provider directs you otherwise. Relief of Pain or Discomfort  Wear a good support bra for breast tenderness.   Take warm sitz baths to soothe any pain or discomfort caused by hemorrhoids. Use hemorrhoid cream if your health care provider approves.   Rest with your legs elevated if you have leg cramps or low back pain.  If you develop varicose veins in your legs, wear support hose. Elevate your feet for 15 minutes, 3-4 times a day. Limit salt in your diet. Prenatal Care  Schedule your prenatal visits by the twelfth week of pregnancy. They are usually scheduled monthly at first, then more often in the last 2 months before delivery.  Write down your questions. Take them to your prenatal visits.  Keep all your prenatal visits as directed by your   health care provider. Safety  Wear your seat belt at all times when driving.  Make a list of emergency phone numbers, including numbers for family, friends, the hospital, and police and fire departments. General Tips  Ask your health care provider for a referral to a local prenatal education class. Begin classes no later than at the beginning of month 6 of your pregnancy.  Ask for help if you have counseling or nutritional needs during pregnancy. Your health care provider can offer advice or refer you to specialists for help with various needs.  Do not use hot tubs, steam rooms, or saunas.  Do not douche or use tampons or scented sanitary pads.  Do not cross your legs for long periods of time.  Avoid cat litter boxes and soil used by cats. These carry germs that can cause birth defects in the baby and possibly loss of the fetus by miscarriage or stillbirth.  Avoid all smoking, herbs, alcohol, and medicines not prescribed by  your health care provider. Chemicals in these affect the formation and growth of the baby.  Do not use any tobacco products, including cigarettes, chewing tobacco, and electronic cigarettes. If you need help quitting, ask your health care provider. You may receive counseling support and other resources to help you quit.  Schedule a dentist appointment. At home, brush your teeth with a soft toothbrush and be gentle when you floss. SEEK MEDICAL CARE IF:   You have dizziness.  You have mild pelvic cramps, pelvic pressure, or nagging pain in the abdominal area.  You have persistent nausea, vomiting, or diarrhea.  You have a bad smelling vaginal discharge.  You have pain with urination.  You notice increased swelling in your face, hands, legs, or ankles. SEEK IMMEDIATE MEDICAL CARE IF:   You have a fever.  You are leaking fluid from your vagina.  You have spotting or bleeding from your vagina.  You have severe abdominal cramping or pain.  You have rapid weight gain or loss.  You vomit blood or material that looks like coffee grounds.  You are exposed to German measles and have never had them.  You are exposed to fifth disease or chickenpox.  You develop a severe headache.  You have shortness of breath.  You have any kind of trauma, such as from a fall or a car accident.   This information is not intended to replace advice given to you by your health care provider. Make sure you discuss any questions you have with your health care provider.   Document Released: 10/24/2001 Document Revised: 11/20/2014 Document Reviewed: 09/09/2013 Elsevier Interactive Patient Education 2016 [employer] Inc.  

## 2016-08-15 NOTE — Progress Notes (Signed)
Subjective:     Patient ID: Caitlyn Cummings, female   DOB: 20-Apr-1981, 35 y.o.   MRN: [id number]  HPI Morrie Sheldonshley is a 35 year old white female in for UPT, and complains of nausea and has used friends zofran,She has history of narcotic addiction and is on subutex from [facility] in [place], Dr Tollie EthPlummer.Has had panic attacks and takes xanax about once a month.   Review of Systems +nausea +missed period Reviewed past medical,surgical, social and family history. Reviewed medications and allergies.     Objective:   Physical Exam BP (!) 92/52 (BP Location: Left Arm, Patient Position: Sitting, Cuff Size: Normal)   Pulse 68   Ht 4\' 11"  (1.499 m)   Wt 104 lb (47.2 kg)   LMP 06/15/2016 (Approximate)   BMI 21.01 kg/m UPT +, about 8+5 weeks by LMP, with EDD 03/22/17, Skin warm and dry. Neck: mid line trachea, normal thyroid, good ROM, no lymphadenopathy noted. Lungs: clear to ausculation bilaterally. Cardiovascular: regular rate and rhythm.Abdomen is soft and non tender,PHQ 9 score 11, she is depressed and has been in the past, she would like meds, will rx lexapro, try to not use xanax.Wil give zofran rx too.      Assessment:     1. Pregnancy examination or test, positive result   2. Pregnancy, unspecified gestational age   193. History of narcotic addiction (HCC)   4. Nausea   5. Pregnancy with inconclusive fetal viability, not applicable or unspecified fetus   6. Other depression       Plan:     Rx prenatal plus #30 take 1 daily with 11 refills Rx zofran 4 mg ODT #20 take 1 every 8 hours prn with 1 refill Return in 2 days for dating US Review handout on first trimester Eat often Try not to use xanax

## 2016-08-18 ENCOUNTER — Ambulatory Visit (INDEPENDENT_AMBULATORY_CARE_PROVIDER_SITE_OTHER): Payer: Medicaid Other

## 2016-08-18 DIAGNOSIS — O3491 Maternal care for abnormality of pelvic organ, unspecified, first trimester: Secondary | ICD-10-CM | POA: Diagnosis not present

## 2016-08-18 DIAGNOSIS — Z3A09 9 weeks gestation of pregnancy: Secondary | ICD-10-CM

## 2016-08-18 DIAGNOSIS — O3680X0 Pregnancy with inconclusive fetal viability, not applicable or unspecified: Secondary | ICD-10-CM | POA: Diagnosis not present

## 2016-08-18 NOTE — Progress Notes (Signed)
US 8+1 wks,single IUP w/ys,pos fht 161 bpm,normal lt ov,simple corpus luteal cyst right ovary 4.4 x 1.4 x 3.6 cm,crl 18.0 mm

## 2016-08-29 ENCOUNTER — Telehealth: Payer: Self-pay | Admitting: Adult Health

## 2016-08-29 ENCOUNTER — Telehealth: Payer: Self-pay | Admitting: *Deleted

## 2016-08-30 NOTE — Telephone Encounter (Signed)
Spoke with pt letting her know Zofran can cause constipation. Advised to increase fluids, eat 4 prunes daily and drink prune juice. If no results, let us know. Pt voiced understanding. JSY

## 2016-08-31 ENCOUNTER — Ambulatory Visit (INDEPENDENT_AMBULATORY_CARE_PROVIDER_SITE_OTHER): Payer: Medicaid Other | Admitting: Women's Health

## 2016-08-31 ENCOUNTER — Encounter: Payer: Self-pay | Admitting: Women's Health

## 2016-08-31 VITALS — BP 90/56 | HR 76 | Wt 100.5 lb

## 2016-08-31 DIAGNOSIS — O99320 Drug use complicating pregnancy, unspecified trimester: Secondary | ICD-10-CM | POA: Diagnosis not present

## 2016-08-31 DIAGNOSIS — Z3481 Encounter for supervision of other normal pregnancy, first trimester: Secondary | ICD-10-CM | POA: Diagnosis not present

## 2016-08-31 DIAGNOSIS — Z331 Pregnant state, incidental: Secondary | ICD-10-CM

## 2016-08-31 DIAGNOSIS — O99331 Smoking (tobacco) complicating pregnancy, first trimester: Secondary | ICD-10-CM | POA: Diagnosis not present

## 2016-08-31 DIAGNOSIS — Z3682 Encounter for antenatal screening for nuchal translucency: Secondary | ICD-10-CM

## 2016-08-31 DIAGNOSIS — F17200 Nicotine dependence, unspecified, uncomplicated: Secondary | ICD-10-CM | POA: Diagnosis not present

## 2016-08-31 DIAGNOSIS — F1120 Opioid dependence, uncomplicated: Secondary | ICD-10-CM

## 2016-08-31 DIAGNOSIS — Z1389 Encounter for screening for other disorder: Secondary | ICD-10-CM

## 2016-08-31 DIAGNOSIS — Z0283 Encounter for blood-alcohol and blood-drug test: Secondary | ICD-10-CM

## 2016-08-31 DIAGNOSIS — F418 Other specified anxiety disorders: Secondary | ICD-10-CM | POA: Insufficient documentation

## 2016-08-31 DIAGNOSIS — Z3490 Encounter for supervision of normal pregnancy, unspecified, unspecified trimester: Secondary | ICD-10-CM | POA: Insufficient documentation

## 2016-08-31 LAB — POCT URINALYSIS DIPSTICK
Glucose, UA: NEGATIVE
Ketones, UA: NEGATIVE
Leukocytes, UA: NEGATIVE
NITRITE UA: NEGATIVE
PROTEIN UA: NEGATIVE
RBC UA: NEGATIVE

## 2016-08-31 MED ORDER — SERTRALINE HCL 25 MG PO TABS: 25.0000 mg | ORAL_TABLET | Freq: Every day | ORAL | 6 refills | Status: DC

## 2016-08-31 NOTE — Telephone Encounter (Signed)
Pt was seen on 08/31/2016.

## 2016-08-31 NOTE — Progress Notes (Signed)
Subjective:  Caitlyn Cummings is a 35 y.o. [id number] Caucasian female at [redacted]w[redacted]d by 8wk u/s, being seen today for her first obstetrical visit.  Her obstetrical history is significant for term uncomplicated svb x 3, EAB x 1, subutex daily, depression/anxiety-see below, smoker 1ppd.  Pregnancy history fully reviewed.  Patient reports stopped lexapro rx'd by [facility] at last visit for depression/anxiety after only taking 1 dose- states it made her feel really bad. Doesn't feel depressed necessarily- but sleeping alot- only up ~2hr/day, denies SI/HI. Does want to try another med for depression to see if it will help. Reports she only takes xanax when she has a panic attack which is only ~2x/year (first said once/mth). On subutex 8mg  TID for h/o opiate addiction, sees Dr. Tollie Eth at [facility] in [place] once/mth; Constipation- no bm in 2wks. Denies vb, cramping, uti s/s, abnormal/malodorous vag d/c, or vulvovaginal itching/irritation.  BP (!) 90/56   Pulse 76   Wt 100 lb 8 oz (45.6 kg)   LMP 06/15/2016 (Approximate)   BMI 20.30 kg/m   HISTORY: OB History  Gravida Para Term Preterm AB Living  5 3 3   1 3   SAB TAB Ectopic Multiple Live Births  0       3    # Outcome Date GA Lbr Len/2nd Weight Sex Delivery Anes PTL Lv  5 Current           4 Term 01/26/11 [redacted]w[redacted]d  6 lb 3 oz (2.807 kg) M Vag-Spont EPI  LIV  3 Term 08/12/08 [redacted]w[redacted]d  6 lb 8 oz (2.948 kg) F Vag-Spont EPI  LIV  2 Term 06/15/06 [redacted]w[redacted]d  5 lb 10 oz (2.551 kg) F Vag-Spont EPI  LIV  1 AB              Past Medical History:  Diagnosis Date  . Acid reflux   . Opiate addiction (HCC)   . Panic attacks    Past Surgical History:  Procedure Laterality Date  . TONSILLECTOMY     Family History  Problem Relation Age of Onset  . COPD Maternal Grandmother   . Other Maternal Grandmother     resp failure; sepsis  . Heart attack Maternal Grandfather   . Hypertension Mother   . Seizures Mother   . Anxiety disorder Mother   . Stroke Mother     mini  .  Depression Mother   . Cerebral palsy Brother     Exam   System:     General: Well developed & nourished, no acute distress   Skin: Warm & dry, normal coloration and turgor, no rashes   Neurologic: Alert & oriented, normal mood   Cardiovascular: Regular rate & rhythm   Respiratory: Effort & rate normal, LCTAB, acyanotic   Abdomen: Soft, non tender   Extremities: normal strength, tone   Thin prep pap smear neg June 2017 at [facility] per pt  FHR: + via informal u/s   Depression screen [facility] 2/9 08/31/2016 08/15/2016  Decreased Interest 3 1  Down, Depressed, Hopeless 0 2  PHQ - 2 Score 3 3  Altered sleeping 3 0  Tired, decreased energy 3 3  Change in appetite 0 0  Feeling bad or failure about yourself  0 0  Trouble concentrating 0 3  Moving slowly or fidgety/restless 0 2  Suicidal thoughts 0 0  PHQ-9 Score 9 11    Assessment:   Pregnancy: [id number] Patient Active Problem List   Diagnosis Date Noted  . Supervision of  normal pregnancy 08/31/2016    Priority: High  . Pregnancy complicated by subutex maintenance, antepartum (HCC) 08/31/2016  . Smoker 08/31/2016  . Depression with anxiety 08/31/2016    [id number] New OB visit Smoker Subutex therapy for h/o opiate addiction Constipation Depression/anxiety  Plan:  Initial labs drawn Continue prenatal vitamins Problem list reviewed and updated Reviewed n/v relief measures and warning s/s to report Reviewed recommended weight gain based on pre-gravid BMI Encouraged well-balanced diet Genetic Screening discussed Integrated Screen: requested Cystic fibrosis screening discussed declined, fob previously tested w/ x-wife whose sister died of CF, FOB is not a carrier Ultrasound discussed; fetal survey: requested Follow up in 2 weeks for 1st it/nt and visit [facility] completed Smokes 1pp/day, advised cessation, discussed risks to fetus while pregnant, to infant pp, and to herself. Offered QuitlineNC, declined Advised no xanax  during pregnancy Do not take anymore lexapro, Rx zoloft 25mg  daily, understands can take few weeks to notice improvement Gave printed prevention/relief measures for constipation, if no bm w/in next few days let us know Declined flu shot  Marge DuncansBooker, Wrigley Plasencia Randall CNM, [facility]NP-BC 08/31/2016 1:07 PM

## 2016-08-31 NOTE — Patient Instructions (Signed)

## 2016-09-01 LAB — URINALYSIS, ROUTINE W REFLEX MICROSCOPIC
BILIRUBIN UA: NEGATIVE
GLUCOSE, UA: NEGATIVE
KETONES UA: NEGATIVE
LEUKOCYTES UA: NEGATIVE
Nitrite, UA: NEGATIVE
PROTEIN UA: NEGATIVE
RBC UA: NEGATIVE
SPEC GRAV UA: 1.021 (ref 1.005–1.030)
Urobilinogen, Ur: 0.2 mg/dL (ref 0.2–1.0)
pH, UA: 6 (ref 5.0–7.5)

## 2016-09-01 LAB — CBC
HEMATOCRIT: 36.6 % (ref 34.0–46.6)
HEMOGLOBIN: 12.5 g/dL (ref 11.1–15.9)
MCH: 30.4 pg (ref 26.6–33.0)
MCHC: 34.2 g/dL (ref 31.5–35.7)
MCV: 89 fL (ref 79–97)
Platelets: 267 10*3/uL (ref 150–379)
RBC: 4.11 x10E6/uL (ref 3.77–5.28)
RDW: 13.2 % (ref 12.3–15.4)
WBC: 8 10*3/uL (ref 3.4–10.8)

## 2016-09-01 LAB — PMP SCREEN PROFILE (10S), URINE
AMPHETAMINE SCRN UR: NEGATIVE ng/mL
Barbiturate Screen, Ur: NEGATIVE ng/mL
Benzodiazepine Screen, Urine: NEGATIVE ng/mL
CANNABINOIDS UR QL SCN: NEGATIVE ng/mL
CREATININE(CRT), U: 158.2 mg/dL (ref 20.0–300.0)
Cocaine(Metab.)Screen, Urine: NEGATIVE ng/mL
METHADONE SCREEN, URINE: NEGATIVE ng/mL
OXYCODONE+OXYMORPHONE UR QL SCN: NEGATIVE ng/mL
Opiate Scrn, Ur: NEGATIVE ng/mL
PCP SCRN UR: NEGATIVE ng/mL
PH UR, DRUG SCRN: 5.3 (ref 4.5–8.9)
PROPOXYPHENE SCREEN: NEGATIVE ng/mL

## 2016-09-01 LAB — ABO/RH: Rh Factor: POSITIVE

## 2016-09-01 LAB — HEPATITIS B SURFACE ANTIGEN: HEP B S AG: NEGATIVE

## 2016-09-01 LAB — RPR: RPR: NONREACTIVE

## 2016-09-01 LAB — RUBELLA SCREEN: Rubella Antibodies, IGG: 2.63 index (ref >0.99)

## 2016-09-01 LAB — HIV ANTIBODY (ROUTINE TESTING W REFLEX): HIV Screen 4th Generation wRfx: NONREACTIVE

## 2016-09-01 LAB — VARICELLA ZOSTER ANTIBODY, IGG: Varicella zoster IgG: 737 index (ref >165)

## 2016-09-01 LAB — ANTIBODY SCREEN: ANTIBODY SCREEN: NEGATIVE

## 2016-09-02 LAB — URINE CULTURE: Organism ID, Bacteria: NO GROWTH

## 2016-09-03 LAB — GC/CHLAMYDIA PROBE AMP
CHLAMYDIA, DNA PROBE: NEGATIVE
NEISSERIA GONORRHOEAE BY PCR: NEGATIVE

## 2016-09-14 ENCOUNTER — Encounter: Payer: Medicaid Other | Admitting: Obstetrics & Gynecology

## 2016-09-14 ENCOUNTER — Other Ambulatory Visit: Payer: Medicaid Other

## 2016-09-14 ENCOUNTER — Other Ambulatory Visit: Payer: Self-pay | Admitting: Adult Health

## 2016-09-18 ENCOUNTER — Encounter: Payer: Self-pay | Admitting: Obstetrics & Gynecology

## 2016-09-18 ENCOUNTER — Ambulatory Visit (INDEPENDENT_AMBULATORY_CARE_PROVIDER_SITE_OTHER): Payer: Medicaid Other

## 2016-09-18 ENCOUNTER — Ambulatory Visit (INDEPENDENT_AMBULATORY_CARE_PROVIDER_SITE_OTHER): Payer: Medicaid Other | Admitting: Obstetrics & Gynecology

## 2016-09-18 VITALS — BP 90/60 | HR 80 | Wt 96.0 lb

## 2016-09-18 DIAGNOSIS — Z3401 Encounter for supervision of normal first pregnancy, first trimester: Secondary | ICD-10-CM

## 2016-09-18 DIAGNOSIS — Z3481 Encounter for supervision of other normal pregnancy, first trimester: Secondary | ICD-10-CM

## 2016-09-18 DIAGNOSIS — Z3A13 13 weeks gestation of pregnancy: Secondary | ICD-10-CM

## 2016-09-18 DIAGNOSIS — O3491 Maternal care for abnormality of pelvic organ, unspecified, first trimester: Secondary | ICD-10-CM

## 2016-09-18 DIAGNOSIS — Z1389 Encounter for screening for other disorder: Secondary | ICD-10-CM

## 2016-09-18 DIAGNOSIS — Z3682 Encounter for antenatal screening for nuchal translucency: Secondary | ICD-10-CM

## 2016-09-18 DIAGNOSIS — Z331 Pregnant state, incidental: Secondary | ICD-10-CM

## 2016-09-18 LAB — POCT URINALYSIS DIPSTICK
Blood, UA: NEGATIVE
GLUCOSE UA: NEGATIVE
KETONES UA: NEGATIVE
Leukocytes, UA: NEGATIVE
Nitrite, UA: NEGATIVE
Protein, UA: NEGATIVE

## 2016-09-18 MED ORDER — METRONIDAZOLE 500 MG PO TABS: 500.0000 mg | ORAL_TABLET | Freq: Two times a day (BID) | ORAL | 0 refills | Status: DC

## 2016-09-18 NOTE — Progress Notes (Signed)
[id number] Estimated Date of Delivery: 03/29/17  Blood pressure 90/60, pulse 80, weight 96 lb (43.5 kg), last menstrual period 06/15/2016.   BP weight and urine results all reviewed and noted.  Please refer to the obstetrical flow sheet for the fundal height and fetal heart rate documentation:  Patient reports good fetal movement, denies any bleeding and no rupture of membranes symptoms or regular contractions. Patient is without complaints. All questions were answered.  Orders Placed This Encounter  Procedures  . Maternal Screen, Integrated #1  . POCT urinalysis dipstick    Plan:  Continued routine obstetrical care, normal NT sonogram  No Follow-up on file.

## 2016-09-18 NOTE — Progress Notes (Signed)
US 12+4 wks,measurements c/w dates,crl 61.0 mm,NB present,NT 1.5 mm,normal lt ov,simple rt corpus luteal cyst 2.6 x 2.8 x 1.7 cm,ant pl gr 0

## 2016-09-20 LAB — MATERNAL SCREEN, INTEGRATED #1
Crown Rump Length: 61 mm
Gest. Age on Collection Date: 12.4 weeks
Maternal Age at EDD: 34.2 years
Nuchal Translucency (NT): 1.5 mm
Number of Fetuses: 1
PAPP-A VALUE: 1525.2 ng/mL
Weight: 96 [lb_av]

## 2016-10-02 ENCOUNTER — Encounter: Payer: Medicaid Other | Admitting: Obstetrics & Gynecology

## 2016-10-17 ENCOUNTER — Encounter: Payer: Medicaid Other | Admitting: Obstetrics & Gynecology

## 2016-10-24 ENCOUNTER — Encounter: Payer: Self-pay | Admitting: Women's Health

## 2016-10-24 ENCOUNTER — Ambulatory Visit (INDEPENDENT_AMBULATORY_CARE_PROVIDER_SITE_OTHER): Payer: Medicaid Other | Admitting: Women's Health

## 2016-10-24 VITALS — BP 94/48 | HR 88 | Wt 106.0 lb

## 2016-10-24 DIAGNOSIS — Z331 Pregnant state, incidental: Secondary | ICD-10-CM

## 2016-10-24 DIAGNOSIS — L292 Pruritus vulvae: Secondary | ICD-10-CM

## 2016-10-24 DIAGNOSIS — Z1389 Encounter for screening for other disorder: Secondary | ICD-10-CM

## 2016-10-24 DIAGNOSIS — Z3682 Encounter for antenatal screening for nuchal translucency: Secondary | ICD-10-CM

## 2016-10-24 DIAGNOSIS — Z363 Encounter for antenatal screening for malformations: Secondary | ICD-10-CM

## 2016-10-24 DIAGNOSIS — Z3A18 18 weeks gestation of pregnancy: Secondary | ICD-10-CM

## 2016-10-24 DIAGNOSIS — Z3482 Encounter for supervision of other normal pregnancy, second trimester: Secondary | ICD-10-CM

## 2016-10-24 LAB — POCT URINALYSIS DIPSTICK
Blood, UA: NEGATIVE
GLUCOSE UA: NEGATIVE
Ketones, UA: NEGATIVE
LEUKOCYTES UA: NEGATIVE
NITRITE UA: NEGATIVE
PROTEIN UA: NEGATIVE

## 2016-10-24 LAB — POCT WET PREP (WET MOUNT)
Clue Cells Wet Prep Whiff POC: NEGATIVE
Trichomonas Wet Prep HPF POC: ABSENT

## 2016-10-24 MED ORDER — TERCONAZOLE 0.4 % VA CREA: 1.0000 | TOPICAL_CREAM | Freq: Every day | VAGINAL | 0 refills | Status: DC

## 2016-10-24 MED ORDER — ONDANSETRON 4 MG PO TBDP: 4.0000 mg | ORAL_TABLET | Freq: Three times a day (TID) | ORAL | 0 refills | Status: DC | PRN

## 2016-10-24 NOTE — Progress Notes (Signed)
Low-risk OB appointment [id number] Estimated Date of Delivery: 03/29/17 BP (!) 94/48   Pulse 88   Wt 106 lb (48.1 kg)   LMP 06/15/2016 (Approximate)   BMI 21.41 kg/m   BP, weight, and urine reviewed.  Refer to obstetrical flow sheet for FH & FHR.  No fm yet. Denies cramping, lof, vb, or uti s/s. No show x 2 appts. Vulvovaginal itching since last appt, tried otc yeast cream, left-over diflucan and nothing helps. Advised against future diflucan in pregnancy.  Spec exam: cx visually closed, mod amt thick white nonodorous d/c, wet prep few yeast, rx terazole 7 Reviewed warning s/s to report. Plan:  Continue routine obstetrical care  F/U in 2wks for OB appointment and anatomy u/s 2nd IT today

## 2016-10-24 NOTE — Patient Instructions (Signed)
Second Trimester of Pregnancy The second trimester is from week 13 through week 28 (months 4 through 6). The second trimester is often a time when you feel your best. Your body has also adjusted to being pregnant, and you begin to feel better physically. Usually, morning sickness has lessened or quit completely, you may have more energy, and you may have an increase in appetite. The second trimester is also a time when the fetus is growing rapidly. At the end of the sixth month, the fetus is about 9 inches long and weighs about 1 pounds. You will likely begin to feel the baby move (quickening) between 18 and 20 weeks of the pregnancy. Body changes during your second trimester Your body continues to go through many changes during your second trimester. The changes vary from woman to woman.  Your weight will continue to increase. You will notice your lower abdomen bulging out.  You may begin to get stretch marks on your hips, abdomen, and breasts.  You may develop headaches that can be relieved by medicines. The medicines should be approved by your health care provider.  You may urinate more often because the fetus is pressing on your bladder.  You may develop or continue to have heartburn as a result of your pregnancy.  You may develop constipation because certain hormones are causing the muscles that push waste through your intestines to slow down.  You may develop hemorrhoids or swollen, bulging veins (varicose veins).  You may have back pain. This is caused by:  Weight gain.  Pregnancy hormones that are relaxing the joints in your pelvis.  A shift in weight and the muscles that support your balance.  Your breasts will continue to grow and they will continue to become tender.  Your gums may bleed and may be sensitive to brushing and flossing.  Dark spots or blotches (chloasma, mask of pregnancy) may develop on your face. This will likely fade after the baby is born.  A dark line  from your belly button to the pubic area (linea nigra) may appear. This will likely fade after the baby is born.  You may have changes in your hair. These can include thickening of your hair, rapid growth, and changes in texture. Some women also have hair loss during or after pregnancy, or hair that feels dry or thin. Your hair will most likely return to normal after your baby is born. What to expect at prenatal visits During a routine prenatal visit:  You will be weighed to make sure you and the fetus are growing normally.  Your blood pressure will be taken.  Your abdomen will be measured to track your baby's growth.  The fetal heartbeat will be listened to.  Any test results from the previous visit will be discussed. Your health care provider may ask you:  How you are feeling.  If you are feeling the baby move.  If you have had any abnormal symptoms, such as leaking fluid, bleeding, severe headaches, or abdominal cramping.  If you are using any tobacco products, including cigarettes, chewing tobacco, and electronic cigarettes.  If you have any questions. Other tests that may be performed during your second trimester include:  Blood tests that check for:  Low iron levels (anemia).  Gestational diabetes (between 24 and 28 weeks).  Rh antibodies. This is to check for a protein on red blood cells (Rh factor).  Urine tests to check for infections, diabetes, or protein in the urine.  An ultrasound to   confirm the proper growth and development of the baby.  An amniocentesis to check for possible genetic problems.  Fetal screens for spina bifida and Down syndrome.  HIV (human immunodeficiency virus) testing. Routine prenatal testing includes screening for HIV, unless you choose not to have this test. Follow these instructions at home: Eating and drinking  Continue to eat regular, healthy meals.  Avoid raw meat, uncooked cheese, cat litter boxes, and soil used by cats. These  carry germs that can cause birth defects in the baby.  Take your prenatal vitamins.  Take 1500-2000 mg of calcium daily starting at the 20th week of pregnancy until you deliver your baby.  If you develop constipation:  Take over-the-counter or prescription medicines.  Drink enough fluid to keep your urine clear or pale yellow.  Eat foods that are high in fiber, such as fresh fruits and vegetables, whole grains, and beans.  Limit foods that are high in fat and processed sugars, such as fried and sweet foods. Activity  Exercise only as directed by your health care provider. Experiencing uterine cramps is a good sign to stop exercising.  Avoid heavy lifting, wear low heel shoes, and practice good posture.  Wear your seat belt at all times when driving.  Rest with your legs elevated if you have leg cramps or low back pain.  Wear a good support bra for breast tenderness.  Do not use hot tubs, steam rooms, or saunas. Lifestyle  Avoid all smoking, herbs, alcohol, and unprescribed drugs. These chemicals affect the formation and growth of the baby.  Do not use any products that contain nicotine or tobacco, such as cigarettes and e-cigarettes. If you need help quitting, ask your health care provider.  A sexual relationship may be continued unless your health care provider directs you otherwise. General instructions  Follow your health care provider's instructions regarding medicine use. There are medicines that are either safe or unsafe to take during pregnancy.  Take warm sitz baths to soothe any pain or discomfort caused by hemorrhoids. Use hemorrhoid cream if your health care provider approves.  If you develop varicose veins, wear support hose. Elevate your feet for 15 minutes, 3-4 times a day. Limit salt in your diet.  Visit your dentist if you have not gone yet during your pregnancy. Use a soft toothbrush to brush your teeth and be gentle when you floss.  Keep all follow-up  prenatal visits as told by your health care provider. This is important. Contact a health care provider if:  You have dizziness.  You have mild pelvic cramps, pelvic pressure, or nagging pain in the abdominal area.  You have persistent nausea, vomiting, or diarrhea.  You have a bad smelling vaginal discharge.  You have pain with urination. Get help right away if:  You have a fever.  You are leaking fluid from your vagina.  You have spotting or bleeding from your vagina.  You have severe abdominal cramping or pain.  You have rapid weight gain or weight loss.  You have shortness of breath with chest pain.  You notice sudden or extreme swelling of your face, hands, ankles, feet, or legs.  You have not felt your baby move in over an hour.  You have severe headaches that do not go away with medicine.  You have vision changes. Summary  The second trimester is from week 13 through week 28 (months 4 through 6). It is also a time when the fetus is growing rapidly.  Your body goes   through many changes during pregnancy. The changes vary from woman to woman.  Avoid all smoking, herbs, alcohol, and unprescribed drugs. These chemicals affect the formation and growth your baby.  Do not use any tobacco products, such as cigarettes, chewing tobacco, and e-cigarettes. If you need help quitting, ask your health care provider.  Contact your health care provider if you have any questions. Keep all prenatal visits as told by your health care provider. This is important. This information is not intended to replace advice given to you by your health care provider. Make sure you discuss any questions you have with your health care provider. Document Released: 10/24/2001 Document Revised: 04/06/2016 Document Reviewed: 12/31/2012 Elsevier Interactive Patient Education  2017 [employer]vier Inc.  

## 2016-10-26 LAB — MATERNAL SCREEN, INTEGRATED #2
ADSF: 0.75
AFP MARKER: 45 ng/mL
AFP MOM: 0.93
CROWN RUMP LENGTH: 61 mm
DIA MoM: 1.52
DIA Value: 356.3 pg/mL
Estriol, Unconjugated: 0.93 ng/mL
GEST. AGE ON COLLECTION DATE: 12.4 wk
GESTATIONAL AGE: 17.6 wk
HCG VALUE: 16.3 [IU]/mL
MATERNAL AGE AT EDD: 34.2 a
NUMBER OF FETUSES: 1
Nuchal Translucency (NT): 1.5 mm
Nuchal Translucency MoM: 0.97
PAPP-A MoM: 0.98
PAPP-A VALUE: 1525.2 ng/mL
TEST RESULTS: NEGATIVE
WEIGHT: 96 [lb_av]
WEIGHT: 96 [lb_av]
hCG MoM: 0.51

## 2016-11-02 ENCOUNTER — Telehealth: Payer: Self-pay | Admitting: Obstetrics & Gynecology

## 2016-11-02 ENCOUNTER — Other Ambulatory Visit: Payer: Self-pay | Admitting: Advanced Practice Midwife

## 2016-11-02 MED ORDER — METOCLOPRAMIDE HCL 10 MG PO TABS
10.0000 mg | ORAL_TABLET | Freq: Four times a day (QID) | ORAL | 1 refills | Status: DC | PRN
Start: 2016-11-02 — End: 2017-02-22

## 2016-11-02 NOTE — Telephone Encounter (Signed)
reglan sent to pharmacy.  May take in addition to zofran.

## 2016-11-02 NOTE — Telephone Encounter (Signed)
Patient called with complaints of nausea for 3 days. She does not think it is a virus. She is taking zofran but its not helping. Can she try anything else? Please advise.

## 2016-11-02 NOTE — Telephone Encounter (Signed)
Informed patient of prescription sent for zofran.

## 2016-11-02 NOTE — Telephone Encounter (Signed)
Pt states that she has throwing up twice today and has taken Zofran but it is not working and would like to know if there is anything else she could take over the counter or another medication called in. Please contact pt

## 2016-11-09 ENCOUNTER — Other Ambulatory Visit: Payer: Medicaid Other

## 2016-11-09 ENCOUNTER — Encounter: Payer: Medicaid Other | Admitting: Obstetrics & Gynecology

## 2016-11-13 NOTE — L&D Delivery Note (Signed)
36 y.o. [id number] at 1968w0d delivered a viable female infant in cephalic, LOA position. no nuchal cord. Anterior shoulder delivered with ease. 60 sec delayed cord clamping. Cord clamped x2 and cut. Placenta delivered spontaneously intact, with 3VC. Fundus firm on exam with massage and pitocin. Good hemostasis noted.  Anesthesia: Epidural Laceration: No sutures required Good hemostasis noted. EBL: 100 cc  Mom and baby recovering in LDR.    Apgars and Weight pending.  Sponge and instrument count were correct x2. Placenta sent to L&D.  Howard PouchLauren Feng, MD PGY-1 Family Medicine 04/05/2017, 7:28 PM   The above was performed under my direct supervision and guidance.

## 2016-11-20 ENCOUNTER — Ambulatory Visit (INDEPENDENT_AMBULATORY_CARE_PROVIDER_SITE_OTHER): Payer: Medicaid Other | Admitting: Obstetrics & Gynecology

## 2016-11-20 ENCOUNTER — Ambulatory Visit (INDEPENDENT_AMBULATORY_CARE_PROVIDER_SITE_OTHER): Payer: Medicaid Other

## 2016-11-20 ENCOUNTER — Encounter: Payer: Self-pay | Admitting: Obstetrics & Gynecology

## 2016-11-20 VITALS — BP 88/40 | HR 73 | Wt 110.0 lb

## 2016-11-20 DIAGNOSIS — Z3482 Encounter for supervision of other normal pregnancy, second trimester: Secondary | ICD-10-CM

## 2016-11-20 DIAGNOSIS — Z363 Encounter for antenatal screening for malformations: Secondary | ICD-10-CM

## 2016-11-20 DIAGNOSIS — Z331 Pregnant state, incidental: Secondary | ICD-10-CM

## 2016-11-20 DIAGNOSIS — Z1389 Encounter for screening for other disorder: Secondary | ICD-10-CM

## 2016-11-20 DIAGNOSIS — Z3402 Encounter for supervision of normal first pregnancy, second trimester: Secondary | ICD-10-CM

## 2016-11-20 DIAGNOSIS — Z3A22 22 weeks gestation of pregnancy: Secondary | ICD-10-CM

## 2016-11-20 DIAGNOSIS — Z3A21 21 weeks gestation of pregnancy: Secondary | ICD-10-CM

## 2016-11-20 LAB — POCT URINALYSIS DIPSTICK
Blood, UA: NEGATIVE
Glucose, UA: NEGATIVE
Ketones, UA: NEGATIVE
LEUKOCYTES UA: NEGATIVE
Nitrite, UA: NEGATIVE
PROTEIN UA: NEGATIVE

## 2016-11-20 NOTE — Progress Notes (Signed)
US 21+4 wks,cephalic,cx 4.3 cm,ant pl gr 0,normal ov's bilat,svp of fluid 5.9 cm,fhr 140 bpm,efw 489 g,anatomy complete,no obvious abnormalities seen

## 2016-11-20 NOTE — Progress Notes (Signed)
[id number] 968w4d Estimated Date of Delivery: 03/29/17  Blood pressure (!) 88/40, pulse 73, weight 110 lb (49.9 kg), last menstrual period 06/15/2016.   BP weight and urine results all reviewed and noted.  Please refer to the obstetrical flow sheet for the fundal height and fetal heart rate documentation:  Patient reports good fetal movement, denies any bleeding and no rupture of membranes symptoms or regular contractions. Patient is without complaints. All questions were answered.  Orders Placed This Encounter  Procedures  . POCT urinalysis dipstick    Plan:  Continued routine obstetrical care, sonogram is normal  Return in about 4 weeks (around 12/18/2016) for LROB.

## 2016-12-19 ENCOUNTER — Encounter: Payer: Medicaid Other | Admitting: Women's Health

## 2016-12-21 ENCOUNTER — Encounter: Payer: Medicaid Other | Admitting: Advanced Practice Midwife

## 2017-01-05 ENCOUNTER — Telehealth: Payer: Self-pay | Admitting: Obstetrics and Gynecology

## 2017-01-05 NOTE — Telephone Encounter (Signed)
Pt called stating that she has missed her appointments because she had the flu and now she has been vomiting and she has a very bad odor and discharge. Please contact pt

## 2017-01-05 NOTE — Telephone Encounter (Signed)
Pt states she is having a greenish/yellowish vaginal d/c, no odor but mild irritation with it, pt also states she is not able to keep anything down, not even water.  Advised pt she will need OV for the d/c and pt states she has an appointment scheduled here on 2/28.  Advised pt if unable to keep liquids down and feel like dehydrated [facility]  Pt verbalized understanding.

## 2017-01-10 ENCOUNTER — Ambulatory Visit (INDEPENDENT_AMBULATORY_CARE_PROVIDER_SITE_OTHER): Payer: Medicaid Other | Admitting: Obstetrics and Gynecology

## 2017-01-10 ENCOUNTER — Encounter: Payer: Self-pay | Admitting: Obstetrics and Gynecology

## 2017-01-10 VITALS — BP 90/50 | HR 69 | Wt 118.6 lb

## 2017-01-10 DIAGNOSIS — Z3483 Encounter for supervision of other normal pregnancy, third trimester: Secondary | ICD-10-CM

## 2017-01-10 DIAGNOSIS — Z3A29 29 weeks gestation of pregnancy: Secondary | ICD-10-CM

## 2017-01-10 DIAGNOSIS — Z1389 Encounter for screening for other disorder: Secondary | ICD-10-CM

## 2017-01-10 DIAGNOSIS — Z331 Pregnant state, incidental: Secondary | ICD-10-CM

## 2017-01-10 DIAGNOSIS — R1011 Right upper quadrant pain: Secondary | ICD-10-CM

## 2017-01-10 LAB — POCT URINALYSIS DIPSTICK
Blood, UA: NEGATIVE
GLUCOSE UA: NEGATIVE
KETONES UA: NEGATIVE
Leukocytes, UA: NEGATIVE
Nitrite, UA: NEGATIVE
Protein, UA: NEGATIVE

## 2017-01-10 NOTE — Progress Notes (Signed)
Caitlyn Cummings is a 36 y.o. female  [id number]  Estimated Date of Delivery: 03/29/17 LROB 2072w6d  Chief Complaint  Patient presents with  . Routine Prenatal Visit    burning sensation under breast; headache  ____  Patient complaints:burning epigastric/RUQ pain. No alleviating factors noted. Patient reports good fetal movement, denies bleeding , rupture of membranes,or regular contractions.  Last menstrual period 06/15/2016.   Urine results: negative refer to the ob flow sheet for FH and FHR, ,                          Physical Examination: General appearance - alert, well appearing, and in no distress                                      Abdomen - FH 26cm ,                                                         -FHR 131bpm                                                         soft, nontender, nondistended, no masses or organomegaly                                      Pelvic - examination not indicated                                            Questions were answered. Assessment: LROB [id number] @ 372w6d  Plan:  Continued routine obstetrical care,   F/u in 1 week for 28 weeks labs 3 wk for LROB visit  By signing my name below, I, Caitlyn Cummings, attest that this documentation has been prepared under the direction and in the presence of Tilda BurrowJohn Cummings Matsue Strom, MD . Electronically Signed: Freida Busmaniana Cummings, Scribe. 01/10/2017. 2:06 PM. I personally performed the services described in this documentation, which was SCRIBED in my presence. The recorded information has been reviewed and considered accurate. It has been edited as necessary during review. Tilda BurrowFERGUSON,Caitlyn Niznik V, MD

## 2017-01-12 ENCOUNTER — Other Ambulatory Visit: Payer: Medicaid Other

## 2017-01-17 NOTE — Progress Notes (Signed)
28 wk labs still pending

## 2017-01-22 ENCOUNTER — Other Ambulatory Visit: Payer: Medicaid Other

## 2017-01-22 DIAGNOSIS — Z131 Encounter for screening for diabetes mellitus: Secondary | ICD-10-CM

## 2017-01-22 DIAGNOSIS — Z3403 Encounter for supervision of normal first pregnancy, third trimester: Secondary | ICD-10-CM

## 2017-01-23 LAB — RPR: RPR Ser Ql: NONREACTIVE

## 2017-01-23 LAB — CBC
HEMATOCRIT: 33 % — AB (ref 34.0–46.6)
HEMOGLOBIN: 11.3 g/dL (ref 11.1–15.9)
MCH: 30.4 pg (ref 26.6–33.0)
MCHC: 34.2 g/dL (ref 31.5–35.7)
MCV: 89 fL (ref 79–97)
PLATELETS: 283 10*3/uL (ref 150–379)
RBC: 3.72 x10E6/uL — AB (ref 3.77–5.28)
RDW: 14.2 % (ref 12.3–15.4)
WBC: 12 10*3/uL — AB (ref 3.4–10.8)

## 2017-01-23 LAB — ANTIBODY SCREEN: Antibody Screen: NEGATIVE

## 2017-01-23 LAB — HIV ANTIBODY (ROUTINE TESTING W REFLEX): HIV SCREEN 4TH GENERATION: NONREACTIVE

## 2017-01-23 LAB — GLUCOSE TOLERANCE, 2 HOURS W/ 1HR
Glucose, 1 hour: 119 mg/dL (ref 65–179)
Glucose, 2 hour: 106 mg/dL (ref 65–152)
Glucose, Fasting: 75 mg/dL (ref 65–91)

## 2017-01-31 ENCOUNTER — Encounter: Payer: Medicaid Other | Admitting: Advanced Practice Midwife

## 2017-02-15 ENCOUNTER — Encounter: Payer: Self-pay | Admitting: Advanced Practice Midwife

## 2017-02-15 ENCOUNTER — Ambulatory Visit (INDEPENDENT_AMBULATORY_CARE_PROVIDER_SITE_OTHER): Payer: Medicaid Other | Admitting: Advanced Practice Midwife

## 2017-02-15 VITALS — BP 100/40 | HR 82 | Wt 124.0 lb

## 2017-02-15 DIAGNOSIS — O26843 Uterine size-date discrepancy, third trimester: Secondary | ICD-10-CM

## 2017-02-15 DIAGNOSIS — Z3483 Encounter for supervision of other normal pregnancy, third trimester: Secondary | ICD-10-CM

## 2017-02-15 DIAGNOSIS — Z1389 Encounter for screening for other disorder: Secondary | ICD-10-CM

## 2017-02-15 DIAGNOSIS — Z331 Pregnant state, incidental: Secondary | ICD-10-CM

## 2017-02-15 LAB — POCT URINALYSIS DIPSTICK
Glucose, UA: NEGATIVE
Ketones, UA: NEGATIVE
Leukocytes, UA: NEGATIVE
NITRITE UA: NEGATIVE
PROTEIN UA: NEGATIVE
RBC UA: NEGATIVE

## 2017-02-15 NOTE — Patient Instructions (Signed)

## 2017-02-15 NOTE — Progress Notes (Signed)
[id number] [redacted]w[redacted]d Estimated Date of Delivery: 03/29/17  Blood pressure (!) 100/40, pulse 82, weight 124 lb (56.2 kg), last menstrual period 06/15/2016.   Has missed all appts since 29 weeks (no real reason )  BP weight and urine results all reviewed and noted.  Please refer to the obstetrical flow sheet for the fundal height and fetal heart rate documentation: size<dates  Patient reports good fetal movement, denies any bleeding and no rupture of membranes symptoms or regular contractions. Patient is without complaints other than normal pregnancy complaints.  Has some abdominal wall pain. To try lidocaine patch All questions were answered.  Orders Placed This Encounter  Procedures  . US OB Follow Up  . POCT urinalysis dipstick    Plan:  Continued routine obstetrical care, NAS consult ordered  Return in about 1 week (around 02/22/2017) for LROB, US:EFW.

## 2017-02-22 ENCOUNTER — Encounter: Payer: Self-pay | Admitting: Obstetrics and Gynecology

## 2017-02-22 ENCOUNTER — Ambulatory Visit (INDEPENDENT_AMBULATORY_CARE_PROVIDER_SITE_OTHER): Payer: Medicaid Other

## 2017-02-22 ENCOUNTER — Ambulatory Visit (INDEPENDENT_AMBULATORY_CARE_PROVIDER_SITE_OTHER): Payer: Medicaid Other | Admitting: Obstetrics and Gynecology

## 2017-02-22 VITALS — BP 100/60 | HR 74 | Wt 126.4 lb

## 2017-02-22 DIAGNOSIS — Z331 Pregnant state, incidental: Secondary | ICD-10-CM

## 2017-02-22 DIAGNOSIS — O26843 Uterine size-date discrepancy, third trimester: Secondary | ICD-10-CM

## 2017-02-22 DIAGNOSIS — Z3483 Encounter for supervision of other normal pregnancy, third trimester: Secondary | ICD-10-CM

## 2017-02-22 DIAGNOSIS — Z3403 Encounter for supervision of normal first pregnancy, third trimester: Secondary | ICD-10-CM

## 2017-02-22 DIAGNOSIS — Z1389 Encounter for screening for other disorder: Secondary | ICD-10-CM

## 2017-02-22 NOTE — Progress Notes (Signed)
[place] 35 wks,cephalic,ant pl gr 3,bilat adnexa's wnl,afi 17 cm,fhr 133 bpm,efw 2543 g 43%

## 2017-02-22 NOTE — Progress Notes (Signed)
[id number]  Estimated Date of Delivery: 03/29/17 LROB [redacted]w[redacted]d  No chief complaint on file. ____  Patient complaints: none at this time. Patient reports   good fetal movement,                           denies any bleeding , rupture of membranes,or regular contractions.  Last menstrual period 06/15/2016.   Urine results:negative refer to the ob flow sheet for FH and FHR, ,                          Physical Examination: General appearance - alert, well appearing, and in no distress                                      Abdomen - FH 34 ,                                                         -FHR 133                                                         soft, nontender, nondistended, no masses or organomegaly                                      Pelvic - examination not indicated                                            Questions were answered. Assessment: LROB [id number] @ [redacted]w[redacted]d, Estimated Date of Delivery: 03/29/17  Had [place] today due to size < dates noted at last visit [facility] infant  Plan:  Continued routine obstetrical care,   F/u in 2 weeks for LROB   By signing my name below, I, Sonum Patel, attest that this documentation has been prepared under the direction and in the presence of Tilda Burrow, MD. Electronically Signed: Sonum Patel, Neurosurgeon. 02/22/17. 12:19 PM.  I personally performed the services described in this documentation, which was SCRIBED in my presence. The recorded information has been reviewed and considered accurate. It has been edited as necessary during review. Tilda Burrow, MD

## 2017-02-26 ENCOUNTER — Encounter (HOSPITAL_COMMUNITY): Payer: Self-pay

## 2017-02-26 NOTE — Progress Notes (Signed)
NICU NAS tour scheduled for 03/08/17 at 1:00.

## 2017-02-28 ENCOUNTER — Encounter: Payer: Medicaid Other | Admitting: Women's Health

## 2017-02-28 ENCOUNTER — Telehealth: Payer: Self-pay | Admitting: *Deleted

## 2017-02-28 NOTE — Telephone Encounter (Signed)
Patient called with complaints of "abdominal burning" along with soreness so much that she was in pain. She states she was not contracting. Baby is moving, she is not leaking or bleeding. Advised to work-in with Selena Batten today. Patient verbalized understanding.

## 2017-03-08 ENCOUNTER — Ambulatory Visit (INDEPENDENT_AMBULATORY_CARE_PROVIDER_SITE_OTHER): Payer: Medicaid Other | Admitting: Women's Health

## 2017-03-08 ENCOUNTER — Encounter: Payer: Self-pay | Admitting: Women's Health

## 2017-03-08 VITALS — BP 104/62 | HR 72 | Wt 132.2 lb

## 2017-03-08 DIAGNOSIS — O26893 Other specified pregnancy related conditions, third trimester: Secondary | ICD-10-CM

## 2017-03-08 DIAGNOSIS — Z1389 Encounter for screening for other disorder: Secondary | ICD-10-CM

## 2017-03-08 DIAGNOSIS — N898 Other specified noninflammatory disorders of vagina: Secondary | ICD-10-CM | POA: Diagnosis not present

## 2017-03-08 DIAGNOSIS — Z3483 Encounter for supervision of other normal pregnancy, third trimester: Secondary | ICD-10-CM

## 2017-03-08 DIAGNOSIS — Z331 Pregnant state, incidental: Secondary | ICD-10-CM

## 2017-03-08 DIAGNOSIS — Z3A37 37 weeks gestation of pregnancy: Secondary | ICD-10-CM

## 2017-03-08 LAB — POCT URINALYSIS DIPSTICK
Blood, UA: NEGATIVE
GLUCOSE UA: NEGATIVE
KETONES UA: NEGATIVE
Leukocytes, UA: NEGATIVE
Nitrite, UA: NEGATIVE
Protein, UA: NEGATIVE

## 2017-03-08 LAB — POCT WET PREP (WET MOUNT)
Clue Cells Wet Prep Whiff POC: NEGATIVE
Trichomonas Wet Prep HPF POC: ABSENT

## 2017-03-08 NOTE — Patient Instructions (Addendum)
Call the office [phone]) or go to [facility] if:  You begin to have strong, frequent contractions  Your water breaks.  Sometimes it is a big gush of fluid, sometimes it is just a trickle that keeps getting your panties wet or running down your legs  You have vaginal bleeding.  It is normal to have a small amount of spotting if your cervix was checked.   You don't feel your baby moving like normal.  If you don't, get you something to eat and drink and lay down and focus on feeling your baby move.  You should feel at least 10 movements in 2 hours.  If you don't, you should call the office or go to [facility].   [place] Pediatricians/Family Doctors:  [facility] [phone]            [facility] [phone]                 [facility] [phone] (usually not accepting new patients unless you have family there already, you are always welcome to call and ask)            [facility] Pediatricians/Family Doctors:   [facility]: [phone]  [facility]: [phone]      Deberah Pelton Contractions Contractions of the uterus can occur throughout pregnancy, but they are not always a sign that you are in labor. You may have practice contractions called Braxton Hicks contractions. These false labor contractions are sometimes confused with true labor. What are Deberah Pelton contractions? Braxton Hicks contractions are tightening movements that occur in the muscles of the uterus before labor. Unlike true labor contractions, these contractions do not result in opening (dilation) and thinning of the cervix. Toward the end of pregnancy (32-34 weeks), Braxton Hicks contractions can happen more often and may become stronger. These contractions are sometimes difficult to tell apart from true labor because they can be very uncomfortable. You should not feel embarrassed if you go to the hospital with false labor. Sometimes,  the only way to tell if you are in true labor is for your health care provider to look for changes in the cervix. The health care provider will do a physical exam and may monitor your contractions. If you are not in true labor, the exam should show that your cervix is not dilating and your water has not broken. If there are no prenatal problems or other health problems associated with your pregnancy, it is completely safe for you to be sent home with false labor. You may continue to have Braxton Hicks contractions until you go into true labor. How can I tell the difference between true labor and false labor?  Differences  False labor  Contractions last 30-70 seconds.: Contractions are usually shorter and not as strong as true labor contractions.  Contractions become very regular.: Contractions are usually irregular.  Discomfort is usually felt in the top of the uterus, and it spreads to the lower abdomen and low back.: Contractions are often felt in the front of the lower abdomen and in the groin.  Contractions do not go away with walking.: Contractions may go away when you walk around or change positions while lying down.  Contractions usually become more intense and increase in frequency.: Contractions get weaker and are shorter-lasting as time goes on.  The cervix dilates and gets thinner.: The cervix usually does not dilate or become thin. Follow these instructions at home:  Take over-the-counter and prescription medicines only as  told by your health care provider.  Keep up with your usual exercises and follow other instructions from your health care provider.  Eat and drink lightly if you think you are going into labor.  If Braxton Hicks contractions are making you uncomfortable: ? Change your position from lying down or resting to walking, or change from walking to resting. ? Sit and rest in a tub of warm water. ? Drink enough fluid to keep your urine clear or pale yellow.  Dehydration may cause these contractions. ? Do slow and deep breathing several times an hour.  Keep all follow-up prenatal visits as told by your health care provider. This is important. Contact a health care provider if:  You have a fever.  You have continuous pain in your abdomen. Get help right away if:  Your contractions become stronger, more regular, and closer together.  You have fluid leaking or gushing from your vagina.  You pass blood-tinged mucus (bloody show).  You have bleeding from your vagina.  You have low back pain that you never had before.  You feel your baby's head pushing down and causing pelvic pressure.  Your baby is not moving inside you as much as it used to. Summary  Contractions that occur before labor are called Braxton Hicks contractions, false labor, or practice contractions.  Braxton Hicks contractions are usually shorter, weaker, farther apart, and less regular than true labor contractions. True labor contractions usually become progressively stronger and regular and they become more frequent.  Manage discomfort from Braxton Hicks contractions by changing position, resting in a warm bath, drinking plenty of water, or practicing deep breathing. This information is not intended to replace advice given to you by your health care provider. Make sure you discuss any questions you have with your health care provider. Document Released: 10/30/2005 Document Revised: 09/18/2016 Document Reviewed: 09/18/2016 Elsevier Interactive Patient Education  2017 Elsevier Inc.  

## 2017-03-08 NOTE — Progress Notes (Signed)
Low-risk OB appointment [id number] [redacted]w[redacted]d Estimated Date of Delivery: 03/29/17 BP 104/62   Pulse 72   Wt 132 lb 3.2 oz (60 kg)   LMP 06/15/2016 (Approximate)   BMI 26.70 kg/m   BP, weight, and urine reviewed.  Refer to obstetrical flow sheet for FH & FHR.  Reports good fm.  Denies regular uc's, lof, vb, or uti s/s. Wants btl, discusses risks/benefits- consent signed today- understands will have to be ~6wks pp d/t timing of signing consent Goes to NAS tour today, still on subutex  TID, had to use 1 xanax the other day- states she has only had to take twice during entire pregnancy Burning pain epigastric area- not heartburn- sounds like diastasis recti. Vag d/c w/ some itching- not bad GBS, gc/ct, wet prep collected- wet prep neg SVE per request: outer os 1cm/thick- pt uncomfortable, didn't want me to keep trying to reach internal os. Vtx by leopolds Reviewed labor s/s, fkc. Plan:  Continue routine obstetrical care  F/U in 1wk for OB appointment

## 2017-03-09 LAB — OB RESULTS CONSOLE GBS: GBS: NEGATIVE

## 2017-03-10 LAB — STREP GP B NAA: STREP GROUP B AG: NEGATIVE

## 2017-03-10 LAB — GC/CHLAMYDIA PROBE AMP
Chlamydia trachomatis, NAA: NEGATIVE
NEISSERIA GONORRHOEAE BY PCR: NEGATIVE

## 2017-03-15 ENCOUNTER — Ambulatory Visit (INDEPENDENT_AMBULATORY_CARE_PROVIDER_SITE_OTHER): Payer: Medicaid Other | Admitting: Advanced Practice Midwife

## 2017-03-15 ENCOUNTER — Encounter: Payer: Self-pay | Admitting: Advanced Practice Midwife

## 2017-03-15 ENCOUNTER — Ambulatory Visit (INDEPENDENT_AMBULATORY_CARE_PROVIDER_SITE_OTHER): Payer: Medicaid Other

## 2017-03-15 VITALS — BP 100/60 | HR 72 | Wt 132.0 lb

## 2017-03-15 DIAGNOSIS — Z79891 Long term (current) use of opiate analgesic: Secondary | ICD-10-CM | POA: Diagnosis not present

## 2017-03-15 DIAGNOSIS — O368130 Decreased fetal movements, third trimester, not applicable or unspecified: Secondary | ICD-10-CM | POA: Diagnosis not present

## 2017-03-15 DIAGNOSIS — F1120 Opioid dependence, uncomplicated: Secondary | ICD-10-CM | POA: Diagnosis not present

## 2017-03-15 DIAGNOSIS — Z3403 Encounter for supervision of normal first pregnancy, third trimester: Secondary | ICD-10-CM

## 2017-03-15 DIAGNOSIS — O288 Other abnormal findings on antenatal screening of mother: Secondary | ICD-10-CM

## 2017-03-15 DIAGNOSIS — O99320 Drug use complicating pregnancy, unspecified trimester: Secondary | ICD-10-CM

## 2017-03-15 DIAGNOSIS — Z3483 Encounter for supervision of other normal pregnancy, third trimester: Secondary | ICD-10-CM

## 2017-03-15 DIAGNOSIS — Z1389 Encounter for screening for other disorder: Secondary | ICD-10-CM

## 2017-03-15 DIAGNOSIS — Z331 Pregnant state, incidental: Secondary | ICD-10-CM

## 2017-03-15 LAB — POCT URINALYSIS DIPSTICK
Blood, UA: NEGATIVE
Glucose, UA: NEGATIVE
Ketones, UA: NEGATIVE
Leukocytes, UA: NEGATIVE
Nitrite, UA: NEGATIVE
Protein, UA: NEGATIVE

## 2017-03-15 NOTE — Progress Notes (Signed)
[id number] 6233w0d Estimated Date of Delivery: 03/29/17  Blood pressure 100/60, pulse 72, weight 132 lb (59.9 kg), last menstrual period 06/15/2016.   BP weight and urine results all reviewed and noted.  Please refer to the obstetrical flow sheet for the fundal height and fetal heart rate documentation:  Patient reports decrease fetal movement this am (baby very active on EFM today). But no accels.  Will get BPP  , denies any bleeding and no rupture of membranes symptoms or regular contractions. Patient is without complaints. All questions were answered.  Missed [facility] NICU tour. Encouraged to rechedule Found a deer tick on her stomach a few days ago  Orders Placed This Encounter  Procedures  . POCT urinalysis dipstick    Plan:  Continued routine obstetrical care,   Return in about 1 week (around 03/22/2017) for LROB.

## 2017-03-15 NOTE — Progress Notes (Signed)
US 38 wks,cephalic,BPP 8/8,ant pl gr 3,bilat adnexa's wnl,fhr 136 bpm,afi 14 cm

## 2017-03-15 NOTE — Patient Instructions (Signed)

## 2017-03-15 NOTE — Addendum Note (Signed)
Addended by: Jacklyn ShellRESENZO-DISHMON, Reni Hausner on: 03/15/2017 11:52 AM   Modules accepted: Orders

## 2017-03-22 ENCOUNTER — Ambulatory Visit (INDEPENDENT_AMBULATORY_CARE_PROVIDER_SITE_OTHER): Payer: Medicaid Other | Admitting: Women's Health

## 2017-03-22 ENCOUNTER — Encounter: Payer: Self-pay | Admitting: Women's Health

## 2017-03-22 VITALS — BP 100/56 | HR 70 | Wt 134.0 lb

## 2017-03-22 DIAGNOSIS — Z1389 Encounter for screening for other disorder: Secondary | ICD-10-CM

## 2017-03-22 DIAGNOSIS — Z3483 Encounter for supervision of other normal pregnancy, third trimester: Secondary | ICD-10-CM

## 2017-03-22 DIAGNOSIS — Z331 Pregnant state, incidental: Secondary | ICD-10-CM

## 2017-03-22 LAB — POCT URINALYSIS DIPSTICK
GLUCOSE UA: NEGATIVE
Ketones, UA: NEGATIVE
Leukocytes, UA: NEGATIVE
NITRITE UA: NEGATIVE
PROTEIN UA: NEGATIVE
RBC UA: NEGATIVE

## 2017-03-22 NOTE — Progress Notes (Signed)
Low-risk OB appointment [id number] 1541w0d Estimated Date of Delivery: 03/29/17 BP (!) 100/56   Pulse 70   Wt 134 lb (60.8 kg)   LMP 06/15/2016 (Approximate)   BMI 27.06 kg/m   BP, weight, and urine reviewed.  Refer to obstetrical flow sheet for FH & FHR.  Reports good fm.  Denies regular uc's, lof, vb, or uti s/s. No complaints. Didn't make NAS tour.  Declines SVE Vtx by Leopold's Reviewed labor s/s, fkc. Plan:  Continue routine obstetrical care  F/U in 1wk for OB appointment

## 2017-03-22 NOTE — Patient Instructions (Signed)
Call the office ([phone]) or go to [facility] if:  You begin to have strong, frequent contractions  Your water breaks.  Sometimes it is a big gush of fluid, sometimes it is just a trickle that keeps getting your panties wet or running down your legs  You have vaginal bleeding.  It is normal to have a small amount of spotting if your cervix was checked.   You don't feel your baby moving like normal.  If you don't, get you something to eat and drink and lay down and focus on feeling your baby move.  You should feel at least 10 movements in 2 hours.  If you don't, you should call the office or go to [facility].     Braxton Hicks Contractions Contractions of the uterus can occur throughout pregnancy, but they are not always a sign that you are in labor. You may have practice contractions called Braxton Hicks contractions. These false labor contractions are sometimes confused with true labor. What are Braxton Hicks contractions? Braxton Hicks contractions are tightening movements that occur in the muscles of the uterus before labor. Unlike true labor contractions, these contractions do not result in opening (dilation) and thinning of the cervix. Toward the end of pregnancy (32-34 weeks), Braxton Hicks contractions can happen more often and may become stronger. These contractions are sometimes difficult to tell apart from true labor because they can be very uncomfortable. You should not feel embarrassed if you go to the hospital with false labor. Sometimes, the only way to tell if you are in true labor is for your health care provider to look for changes in the cervix. The health care provider will do a physical exam and may monitor your contractions. If you are not in true labor, the exam should show that your cervix is not dilating and your water has not broken. If there are no prenatal problems or other health problems associated with your pregnancy, it is completely safe for you to be sent  home with false labor. You may continue to have Braxton Hicks contractions until you go into true labor. How can I tell the difference between true labor and false labor?  Differences ? False labor ? Contractions last 30-70 seconds.: Contractions are usually shorter and not as strong as true labor contractions. ? Contractions become very regular.: Contractions are usually irregular. ? Discomfort is usually felt in the top of the uterus, and it spreads to the lower abdomen and low back.: Contractions are often felt in the front of the lower abdomen and in the groin. ? Contractions do not go away with walking.: Contractions may go away when you walk around or change positions while lying down. ? Contractions usually become more intense and increase in frequency.: Contractions get weaker and are shorter-lasting as time goes on. ? The cervix dilates and gets thinner.: The cervix usually does not dilate or become thin. Follow these instructions at home:  Take over-the-counter and prescription medicines only as told by your health care provider.  Keep up with your usual exercises and follow other instructions from your health care provider.  Eat and drink lightly if you think you are going into labor.  If Braxton Hicks contractions are making you uncomfortable: ? Change your position from lying down or resting to walking, or change from walking to resting. ? Sit and rest in a tub of warm water. ? Drink enough fluid to keep your urine clear or pale yellow. Dehydration may cause these contractions. ?   Do slow and deep breathing several times an hour.  Keep all follow-up prenatal visits as told by your health care provider. This is important. Contact a health care provider if:  You have a fever.  You have continuous pain in your abdomen. Get help right away if:  Your contractions become stronger, more regular, and closer together.  You have fluid leaking or gushing from your vagina.  You  pass blood-tinged mucus (bloody show).  You have bleeding from your vagina.  You have low back pain that you never had before.  You feel your baby's head pushing down and causing pelvic pressure.  Your baby is not moving inside you as much as it used to. Summary  Contractions that occur before labor are called Braxton Hicks contractions, false labor, or practice contractions.  Braxton Hicks contractions are usually shorter, weaker, farther apart, and less regular than true labor contractions. True labor contractions usually become progressively stronger and regular and they become more frequent.  Manage discomfort from Braxton Hicks contractions by changing position, resting in a warm bath, drinking plenty of water, or practicing deep breathing. This information is not intended to replace advice given to you by your health care provider. Make sure you discuss any questions you have with your health care provider. Document Released: 10/30/2005 Document Revised: 09/18/2016 Document Reviewed: 09/18/2016 Elsevier Interactive Patient Education  2017 [employer] Inc[employer]  

## 2017-03-29 ENCOUNTER — Ambulatory Visit (INDEPENDENT_AMBULATORY_CARE_PROVIDER_SITE_OTHER): Payer: Medicaid Other | Admitting: Obstetrics and Gynecology

## 2017-03-29 ENCOUNTER — Encounter: Payer: Self-pay | Admitting: Obstetrics and Gynecology

## 2017-03-29 VITALS — BP 102/64 | HR 92 | Wt 134.2 lb

## 2017-03-29 DIAGNOSIS — Z3009 Encounter for other general counseling and advice on contraception: Secondary | ICD-10-CM

## 2017-03-29 DIAGNOSIS — Z3403 Encounter for supervision of normal first pregnancy, third trimester: Secondary | ICD-10-CM

## 2017-03-29 DIAGNOSIS — Z331 Pregnant state, incidental: Secondary | ICD-10-CM

## 2017-03-29 DIAGNOSIS — Z1389 Encounter for screening for other disorder: Secondary | ICD-10-CM

## 2017-03-29 LAB — POCT URINALYSIS DIPSTICK
Blood, UA: NEGATIVE
Glucose, UA: NEGATIVE
Ketones, UA: NEGATIVE
LEUKOCYTES UA: NEGATIVE
NITRITE UA: NEGATIVE
PROTEIN UA: NEGATIVE

## 2017-03-29 NOTE — Progress Notes (Signed)
Patient ID: C[facility]lyn [facility], female   DOB: Aug 14, 1981, 36 y.o.   MRN: [id number]  [id number]  Estimated Date of Delivery: 03/29/17 LROB 2654w0d  Chief Complaint  Patient presents with  . Routine Prenatal Visit  ____  Patient complaints: she had contractions for a short period last night that have since resolved. She states she desires tubal ligation, but has not signed tubal papers.   Patient reports good fetal movement. She denies any bleeding, rupture of membranes,or regular contractions.  Blood pressure 102/64, pulse 92, weight 134 lb 3.2 oz (60.9 kg), last menstrual period 06/15/2016.   Urine results:notable for none refer to the ob flow sheet for FH and FHR, ,                          Physical Examination: General appearance - alert, well appearing, and in no distress                                      Abdomen - FH 36 cm                                                        -FHR 125 bpm                                                         soft, nontender, nondistended, no masses or organomegaly                                      Pelvic - VULVA: normal appearing vulva with no masses, tenderness or lesions, VAGINA: normal appearing vagina with normal color and discharge, no lesions, CERVIX: normal appearing cervix without discharge or lesions, 1 cm, soft                                            Questions were answered. Assessment: LROB [id number] @ 4654w0d   Plan:   1. Continued routine obstetrical care 2.  IOL scheduled 04/05/17 @ 0630  3. Tubal papers signed today   [facility], f/u in 5w for post partum    By signing my name below, I, Doreatha MartinEva Mathews, attest that this documentation has been prepared under the direction and in the presence of Tilda BurrowFerguson, Binnie Vonderhaar V, MD. Electronically Signed: Doreatha MartinEva Mathews, ED Scribe. 03/29/17. 12:21 PM.  I personally performed the services described in this documentation, which was SCRIBED in my presence. The recorded information has been  reviewed and considered accurate. It has been edited as necessary during review. Tilda BurrowFERGUSON,Marrion Finan V, MD

## 2017-03-30 DIAGNOSIS — Z3009 Encounter for other general counseling and advice on contraception: Secondary | ICD-10-CM | POA: Insufficient documentation

## 2017-04-02 ENCOUNTER — Telehealth (HOSPITAL_COMMUNITY): Payer: Self-pay | Admitting: *Deleted

## 2017-04-02 ENCOUNTER — Encounter (HOSPITAL_COMMUNITY): Payer: Self-pay | Admitting: *Deleted

## 2017-04-02 NOTE — Telephone Encounter (Signed)
Preadmission screen  

## 2017-04-04 ENCOUNTER — Other Ambulatory Visit: Payer: Self-pay | Admitting: Obstetrics and Gynecology

## 2017-04-05 ENCOUNTER — Encounter (HOSPITAL_COMMUNITY): Payer: Self-pay

## 2017-04-05 ENCOUNTER — Inpatient Hospital Stay (HOSPITAL_COMMUNITY)
Admission: RE | Admit: 2017-04-05 | Discharge: 2017-04-07 | DRG: 775 | Disposition: A | Discharge status: Home / Self Care | Payer: Medicaid Other | Source: Ambulatory Visit | Attending: Obstetrics & Gynecology | Admitting: Obstetrics & Gynecology

## 2017-04-05 ENCOUNTER — Inpatient Hospital Stay (HOSPITAL_COMMUNITY): Payer: Medicaid Other | Admitting: Anesthesiology

## 2017-04-05 DIAGNOSIS — O9962 Diseases of the digestive system complicating childbirth: Secondary | ICD-10-CM | POA: Diagnosis present

## 2017-04-05 DIAGNOSIS — O480 Post-term pregnancy: Principal | ICD-10-CM | POA: Diagnosis present

## 2017-04-05 DIAGNOSIS — Z3A41 41 weeks gestation of pregnancy: Secondary | ICD-10-CM

## 2017-04-05 DIAGNOSIS — Z8249 Family history of ischemic heart disease and other diseases of the circulatory system: Secondary | ICD-10-CM | POA: Diagnosis not present

## 2017-04-05 DIAGNOSIS — F17210 Nicotine dependence, cigarettes, uncomplicated: Secondary | ICD-10-CM | POA: Diagnosis present

## 2017-04-05 DIAGNOSIS — O99344 Other mental disorders complicating childbirth: Secondary | ICD-10-CM | POA: Diagnosis present

## 2017-04-05 DIAGNOSIS — K219 Gastro-esophageal reflux disease without esophagitis: Secondary | ICD-10-CM | POA: Diagnosis present

## 2017-04-05 DIAGNOSIS — F418 Other specified anxiety disorders: Secondary | ICD-10-CM | POA: Diagnosis present

## 2017-04-05 DIAGNOSIS — O99334 Smoking (tobacco) complicating childbirth: Secondary | ICD-10-CM | POA: Diagnosis present

## 2017-04-05 DIAGNOSIS — Z3403 Encounter for supervision of normal first pregnancy, third trimester: Secondary | ICD-10-CM

## 2017-04-05 LAB — TYPE AND SCREEN
ABO/RH(D): A POS
Antibody Screen: NEGATIVE

## 2017-04-05 LAB — CBC
HCT: 32.6 % — ABNORMAL LOW (ref 36.0–46.0)
Hemoglobin: 11.1 g/dL — ABNORMAL LOW (ref 12.0–15.0)
MCH: 30.2 pg (ref 26.0–34.0)
MCHC: 34 g/dL (ref 30.0–36.0)
MCV: 88.8 fL (ref 78.0–100.0)
Platelets: 192 10*3/uL (ref 150–400)
RBC: 3.67 MIL/uL — ABNORMAL LOW (ref 3.87–5.11)
RDW: 13.4 % (ref 11.5–15.5)
WBC: 11.8 10*3/uL — ABNORMAL HIGH (ref 4.0–10.5)

## 2017-04-05 LAB — RPR: RPR: NONREACTIVE

## 2017-04-05 LAB — ABO/RH: ABO/RH(D): A POS

## 2017-04-05 MED ORDER — OXYCODONE-ACETAMINOPHEN 5-325 MG PO TABS: 1.0000 | ORAL_TABLET | ORAL | Status: DC | PRN

## 2017-04-05 MED ORDER — METHYLERGONOVINE MALEATE 0.2 MG/ML IJ SOLN: 0.2000 mg | INTRAMUSCULAR | Status: DC | PRN

## 2017-04-05 MED ORDER — PRENATAL MULTIVITAMIN CH
1.0000 | ORAL_TABLET | Freq: Every day | ORAL | Status: DC
  Filled 2017-04-05 (×2): qty 1

## 2017-04-05 MED ORDER — PRENATAL MULTIVITAMIN CH: 1.0000 | ORAL_TABLET | Freq: Every day | ORAL | Status: DC

## 2017-04-05 MED ORDER — EPHEDRINE 5 MG/ML INJ
10.0000 mg | INTRAVENOUS | Status: DC | PRN
  Filled 2017-04-05: qty 2

## 2017-04-05 MED ORDER — OXYTOCIN BOLUS FROM INFUSION
500.0000 mL | Freq: Once | INTRAVENOUS | Status: AC
  Administered 2017-04-05: 500 mL via INTRAVENOUS

## 2017-04-05 MED ORDER — ACETAMINOPHEN 325 MG PO TABS
650.0000 mg | ORAL_TABLET | ORAL | Status: DC | PRN
Start: 2017-04-05 — End: 2017-04-07
  Administered 2017-04-06 (×2): 650 mg via ORAL
  Filled 2017-04-05 (×2): qty 2

## 2017-04-05 MED ORDER — IBUPROFEN 600 MG PO TABS
600.0000 mg | ORAL_TABLET | Freq: Four times a day (QID) | ORAL | Status: DC
  Administered 2017-04-05 – 2017-04-07 (×7): 600 mg via ORAL
  Filled 2017-04-05 (×7): qty 1

## 2017-04-05 MED ORDER — MEASLES, MUMPS & RUBELLA VAC ~~LOC~~ INJ: 0.5000 mL | INJECTION | Freq: Once | SUBCUTANEOUS | Status: DC

## 2017-04-05 MED ORDER — SIMETHICONE 80 MG PO CHEW: 80.0000 mg | CHEWABLE_TABLET | ORAL | Status: DC | PRN

## 2017-04-05 MED ORDER — PHENYLEPHRINE 40 MCG/ML (10ML) SYRINGE FOR IV PUSH (FOR BLOOD PRESSURE SUPPORT)
80.0000 ug | PREFILLED_SYRINGE | INTRAVENOUS | Status: DC | PRN
  Filled 2017-04-05: qty 5

## 2017-04-05 MED ORDER — SOD CITRATE-CITRIC ACID 500-334 MG/5ML PO SOLN: 30.0000 mL | ORAL | Status: DC | PRN

## 2017-04-05 MED ORDER — BENZOCAINE-MENTHOL 20-0.5 % EX AERO
1.0000 "application " | INHALATION_SPRAY | CUTANEOUS | Status: DC | PRN
  Administered 2017-04-06: 1 via TOPICAL
  Filled 2017-04-05: qty 56

## 2017-04-05 MED ORDER — METHYLERGONOVINE MALEATE 0.2 MG PO TABS: 0.2000 mg | ORAL_TABLET | ORAL | Status: DC | PRN

## 2017-04-05 MED ORDER — COCONUT OIL OIL: 1.0000 "application " | TOPICAL_OIL | Status: DC | PRN

## 2017-04-05 MED ORDER — FLEET ENEMA 7-19 GM/118ML RE ENEM: 1.0000 | ENEMA | Freq: Every day | RECTAL | Status: DC | PRN

## 2017-04-05 MED ORDER — BUPRENORPHINE HCL 8 MG SL SUBL
8.0000 mg | SUBLINGUAL_TABLET | Freq: Three times a day (TID) | SUBLINGUAL | Status: DC
  Administered 2017-04-05 – 2017-04-07 (×5): 8 mg via SUBLINGUAL
  Filled 2017-04-05 (×5): qty 1

## 2017-04-05 MED ORDER — TERBUTALINE SULFATE 1 MG/ML IJ SOLN
0.2500 mg | Freq: Once | INTRAMUSCULAR | Status: DC | PRN
  Filled 2017-04-05: qty 1

## 2017-04-05 MED ORDER — LACTATED RINGERS IV SOLN: 500.0000 mL | INTRAVENOUS | Status: DC | PRN

## 2017-04-05 MED ORDER — BISACODYL 10 MG RE SUPP: 10.0000 mg | Freq: Every day | RECTAL | Status: DC | PRN

## 2017-04-05 MED ORDER — FENTANYL 2.5 MCG/ML BUPIVACAINE 1/10 % EPIDURAL INFUSION (WH - ANES)
INTRAMUSCULAR | Status: AC
  Filled 2017-04-05: qty 100

## 2017-04-05 MED ORDER — OXYTOCIN 40 UNITS IN LACTATED RINGERS INFUSION - SIMPLE MED
1.0000 m[IU]/min | INTRAVENOUS | Status: DC
  Administered 2017-04-05: 2 m[IU]/min via INTRAVENOUS

## 2017-04-05 MED ORDER — PHENYLEPHRINE 40 MCG/ML (10ML) SYRINGE FOR IV PUSH (FOR BLOOD PRESSURE SUPPORT)
PREFILLED_SYRINGE | INTRAVENOUS | Status: AC
  Filled 2017-04-05: qty 10

## 2017-04-05 MED ORDER — ONDANSETRON HCL 4 MG PO TABS: 4.0000 mg | ORAL_TABLET | ORAL | Status: DC | PRN

## 2017-04-05 MED ORDER — WITCH HAZEL-GLYCERIN EX PADS: 1.0000 "application " | MEDICATED_PAD | CUTANEOUS | Status: DC | PRN

## 2017-04-05 MED ORDER — LACTATED RINGERS IV SOLN: 500.0000 mL | Freq: Once | INTRAVENOUS | Status: DC

## 2017-04-05 MED ORDER — MISOPROSTOL 25 MCG QUARTER TABLET
25.0000 ug | ORAL_TABLET | ORAL | Status: DC | PRN
  Administered 2017-04-05: 25 ug via VAGINAL
  Filled 2017-04-05 (×2): qty 1

## 2017-04-05 MED ORDER — ONDANSETRON HCL 4 MG/2ML IJ SOLN
4.0000 mg | Freq: Four times a day (QID) | INTRAMUSCULAR | Status: DC | PRN
Start: 2017-04-05 — End: 2017-04-05

## 2017-04-05 MED ORDER — ONDANSETRON HCL 4 MG/2ML IJ SOLN: 4.0000 mg | INTRAMUSCULAR | Status: DC | PRN

## 2017-04-05 MED ORDER — DIPHENHYDRAMINE HCL 50 MG/ML IJ SOLN: 12.5000 mg | INTRAMUSCULAR | Status: DC | PRN

## 2017-04-05 MED ORDER — DOCUSATE SODIUM 100 MG PO CAPS
100.0000 mg | ORAL_CAPSULE | Freq: Two times a day (BID) | ORAL | Status: DC
  Administered 2017-04-05 – 2017-04-07 (×4): 100 mg via ORAL
  Filled 2017-04-05 (×4): qty 1

## 2017-04-05 MED ORDER — BUPRENORPHINE HCL 8 MG SL SUBL
8.0000 mg | SUBLINGUAL_TABLET | Freq: Three times a day (TID) | SUBLINGUAL | Status: DC
  Administered 2017-04-05 (×2): 8 mg via SUBLINGUAL
  Filled 2017-04-05 (×2): qty 1

## 2017-04-05 MED ORDER — FENTANYL CITRATE (PF) 100 MCG/2ML IJ SOLN: 100.0000 ug | INTRAMUSCULAR | Status: DC | PRN

## 2017-04-05 MED ORDER — OXYTOCIN 40 UNITS IN LACTATED RINGERS INFUSION - SIMPLE MED
2.5000 [IU]/h | INTRAVENOUS | Status: DC
  Filled 2017-04-05: qty 1000

## 2017-04-05 MED ORDER — NON FORMULARY: 8.0000 mg | Freq: Three times a day (TID) | Status: DC

## 2017-04-05 MED ORDER — SERTRALINE HCL 25 MG PO TABS
25.0000 mg | ORAL_TABLET | Freq: Every day | ORAL | Status: DC
  Administered 2017-04-06 (×2): 25 mg via ORAL
  Filled 2017-04-05 (×3): qty 1

## 2017-04-05 MED ORDER — TETANUS-DIPHTH-ACELL PERTUSSIS 5-2.5-18.5 LF-MCG/0.5 IM SUSP: 0.5000 mL | Freq: Once | INTRAMUSCULAR | Status: DC

## 2017-04-05 MED ORDER — FENTANYL 2.5 MCG/ML BUPIVACAINE 1/10 % EPIDURAL INFUSION (WH - ANES)
14.0000 mL/h | INTRAMUSCULAR | Status: DC | PRN
  Administered 2017-04-05: 14 mL/h via EPIDURAL
  Filled 2017-04-05: qty 100

## 2017-04-05 MED ORDER — SERTRALINE HCL 25 MG PO TABS
25.0000 mg | ORAL_TABLET | Freq: Every day | ORAL | Status: DC
  Filled 2017-04-05: qty 1

## 2017-04-05 MED ORDER — FENTANYL 2.5 MCG/ML BUPIVACAINE 1/10 % EPIDURAL INFUSION (WH - ANES)
14.0000 mL/h | INTRAMUSCULAR | Status: DC | PRN
  Administered 2017-04-05 (×2): 14 mL/h via EPIDURAL

## 2017-04-05 MED ORDER — OXYCODONE-ACETAMINOPHEN 5-325 MG PO TABS: 2.0000 | ORAL_TABLET | ORAL | Status: DC | PRN

## 2017-04-05 MED ORDER — FERROUS SULFATE 325 (65 FE) MG PO TABS
325.0000 mg | ORAL_TABLET | Freq: Two times a day (BID) | ORAL | Status: DC
  Administered 2017-04-06 – 2017-04-07 (×2): 325 mg via ORAL
  Filled 2017-04-05 (×2): qty 1

## 2017-04-05 MED ORDER — LACTATED RINGERS IV SOLN
INTRAVENOUS | Status: DC
  Administered 2017-04-05: 08:00:00 via INTRAVENOUS

## 2017-04-05 MED ORDER — ACETAMINOPHEN 325 MG PO TABS: 650.0000 mg | ORAL_TABLET | ORAL | Status: DC | PRN

## 2017-04-05 MED ORDER — LIDOCAINE HCL (PF) 1 % IJ SOLN
30.0000 mL | INTRAMUSCULAR | Status: DC | PRN
  Filled 2017-04-05: qty 30

## 2017-04-05 MED ORDER — LIDOCAINE HCL (PF) 1 % IJ SOLN
INTRAMUSCULAR | Status: DC | PRN
  Administered 2017-04-05 (×2): 5 mL via EPIDURAL

## 2017-04-05 MED ORDER — DIPHENHYDRAMINE HCL 25 MG PO CAPS: 25.0000 mg | ORAL_CAPSULE | Freq: Four times a day (QID) | ORAL | Status: DC | PRN

## 2017-04-05 MED ORDER — DIBUCAINE 1 % RE OINT: 1.0000 "application " | TOPICAL_OINTMENT | RECTAL | Status: DC | PRN

## 2017-04-05 NOTE — Anesthesia Procedure Notes (Signed)
Epidural Patient location during procedure: OB Start time: 04/05/2017 10:56 AM End time: 04/05/2017 11:03 AM  Staffing Anesthesiologist: Chaney MallingHODIERNE, Ova Gillentine Performed: anesthesiologist   Preanesthetic Checklist Completed: patient identified, site marked, pre-op evaluation, timeout performed, IV checked, risks and benefits discussed and monitors and equipment checked  Epidural Patient position: sitting Prep: DuraPrep Patient monitoring: heart rate, cardiac monitor, continuous pulse ox and blood pressure Approach: midline Location: L2-L3 Injection technique: LOR saline  Needle:  Needle type: Tuohy  Needle gauge: 17 G Needle length: 9 cm Needle insertion depth: 6 cm Catheter type: closed end flexible Catheter size: 19 Gauge Catheter at skin depth: 11 cm Test dose: negative and Other  Assessment Events: blood not aspirated, injection not painful, no injection resistance and negative IV test  Additional Notes Informed consent obtained prior to proceeding including risk of failure, 1% risk of PDPH, risk of minor discomfort and bruising.  Discussed rare but serious complications including epidural abscess, permanent nerve injury, epidural hematoma.  Discussed alternatives to epidural analgesia and patient desires to proceed.  Timeout performed pre-procedure verifying patient name, procedure, and platelet count.  Patient tolerated procedure well. Reason for block:procedure for pain

## 2017-04-05 NOTE — Progress Notes (Signed)
S: Patient seen & examined for progress of labor. Pain controlled with IV meds   O:  Vitals:   04/05/17 1200 04/05/17 1241 04/05/17 1330 04/05/17 1401  BP: 105/88 (!) 103/50 113/76 (!) 130/58  Pulse: 67 70 68 64  Resp: 18 18 18 18   Temp:      TempSrc:      SpO2:      Weight:      Height:        Dilation: 2 Effacement (%): 70 Station: -2 Presentation: Vertex Exam by:: Foye ClockS. Oklesh RN   FHT: 120bpm, mod var, +accels, no decels TOCO: q284min   A/P:  Continue expectant management Anticipate SVD

## 2017-04-05 NOTE — H&P (Signed)
LABOR ADMISSION HISTORY AND PHYSICAL  Caitlyn [facility] is a 36 y.o. female [phone] with IUP at [redacted]w[redacted]d by 8wk [place] presenting for post-dates IOL. She reports +FM, + contractions, No LOF, no VB, no blurry vision, headaches or peripheral edema, and RUQ pain.  She plans on bottle feeding. She request BTL for birth control which she states she wishes to have at 6 weeks. (papers signed 03/29/17).  Dating: By 8wk [place] --->  Estimated Date of Delivery: 03/29/17  Sono:    @[redacted]w[redacted]d , CWD, normal anatomy, cephalic presentation, 2543g, 45% EFW   Prenatal History/Complications:  Past Medical History: Past Medical History:  Diagnosis Date  . Acid reflux   . Depression   . Opiate addiction (HCC)   . Panic attacks     Past Surgical History: Past Surgical History:  Procedure Laterality Date  . TONSILLECTOMY      Obstetrical History: OB History    Gravida Para Term Preterm AB Living   5 3 3   1 3    SAB TAB Ectopic Multiple Live Births   0       3      Social History: Social History   Social History  . Marital status: Single    Spouse name: N/A  . Number of children: N/A  . Years of education: N/A   Social History Main Topics  . Smoking status: Current Every Day Smoker    Packs/day: 0.50    Years: 20.00    Types: Cigarettes  . Smokeless tobacco: Never Used  . Alcohol use No  . Drug use: No  . Sexual activity: Yes    Birth control/ protection: None   Other Topics Concern  . Not on file   Social History Narrative  . No narrative on file    Family History: Family History  Problem Relation Age of Onset  . COPD Maternal Grandmother   . Other Maternal Grandmother        resp failure; sepsis  . Heart attack Maternal Grandfather   . Hypertension Mother   . Seizures Mother   . Anxiety disorder Mother   . Stroke Mother        mini  . Depression Mother   . Cerebral palsy Brother     Allergies: Allergies  Allergen Reactions  . Tamiflu [Oseltamivir Phosphate] Hives     Prescriptions Prior to Admission  Medication Sig Dispense Refill Last Dose  . ALPRAZolam (XANAX) 1 MG tablet Take 1 mg by mouth as needed for anxiety.   Taking  . buprenorphine (SUBUTEX) 8 MG SUBL SL tablet Place 8 mg under the tongue 3 (three) times daily.   Taking  . Pediatric Multiple Vit-C-FA (FLINSTONES GUMMIES OMEGA-3 DHA PO) Take by mouth. Takes 2 daily   Taking  . ranitidine (ZANTAC) 150 MG tablet Take 150 mg by mouth 2 (two) times daily.   Taking  . sertraline (ZOLOFT) 25 MG tablet Take 1 tablet (25 mg total) by mouth daily. 30 tablet 6 Taking     Review of Systems   All systems reviewed and negative except as stated in HPI  LMP 06/15/2016 (Approximate)  General appearance: alert, cooperative, appears stated age and no distress Lungs: clear to auscultation bilaterally Heart: regular rate and rhythm Abdomen: soft, non-tender; bowel sounds normal Extremities: no LE edema, no calf tenderness Presentation: cephalic Fetal monitoring Baseline 150 bpm, moderate variability, accels no decels Uterine activity Irregular contractions/occasional    Prenatal labs: ABO, Rh: A/Positive/-- (10/19 1222) Antibody: Negative (03/12 0915)  Rubella:  Immune RPR: Non Reactive (03/12 0915)  HBsAg: Negative (10/19 1222)  HIV: Non Reactive (03/12 0915)  GBS: Negative (04/27 0000)  1 hr Glucola: Failed; 3 hr passed 75/119/96 Genetic screening: Neg Anatomy US: Normal female  Prenatal Transfer Tool  Maternal Diabetes: No Genetic Screening: Normal Maternal Ultrasounds/Referrals: Normal Fetal Ultrasounds or other Referrals:  None Maternal Substance Abuse:  Yes:  Type: Smoker Significant Maternal Medications:  Meds include: Zoloft Other: Xanax, Subutex Significant Maternal Lab Results: None  No results found for this or any previous visit (from the past 24 hour(s)).  Patient Active Problem List   Diagnosis Date Noted  . Post-dates pregnancy 04/05/2017  . Encounter for general  counseling and advice on contraceptive management 03/30/2017  . Supervision of normal pregnancy 08/31/2016  . Pregnancy complicated by subutex maintenance, antepartum (HCC) 08/31/2016  . Smoker 08/31/2016  . Depression with anxiety 08/31/2016    Assessment: Caitlyn Manlyshley N Cummings is a 36 y.o. [id number] at 282w0d here for post-dates IOL.  History of substance abuse, on Subutex - Subutex - spoke with pharmacy: order as non-formulary exactly as it is taken at home because she is a pregnant patient. Continued home subutex.  Depression with anxiety - holding home Xanax 1 mg PRN anxiety - Continue Zoloft 25 mg daily  IOL for post-dates #Labor: Begin induction with cytotec #Pain: IV pain medication or epidural available upon patient request #FWB: Category I tracing #ID: GBS [id number] #MOF: Formula #MOC:BTL (papers signed 05/17) #Circ:  N/A  Howard PouchLauren Feng, MD PGY-1 [facility]    OB FELLOW HISTORY AND PHYSICAL ATTESTATION  I confirm that I have verified the information documented in the resident's note and that I have also personally reperformed the physical exam and all medical decision making activities.      Ernestina Pennaicholas Schenk 04/05/2017, 10:04 AM

## 2017-04-05 NOTE — Anesthesia Pain Management Evaluation Note (Signed)
  CRNA Pain Management Visit Note  Patient: Caitlyn [facility]ings, 36 y.o., female  "[facility]. We have an anesthesia team available at all times to provide care throughout the hospital, including epidural management and anesthesia for C-section. I don't know your plan for the delivery whether it a natural birth, water birth, IV sedation, nitrous supplementation, doula or epidural, but we want to meet your pain goals."   1.Was your pain managed to your expectations on prior hospitalizations?   Yes   2.What is your expectation for pain management during this hospitalization?     Epidural  3.How can we help you reach that goal? epidual  Record the patient's initial score and the patient's pain goal.   Pain: 0  Pain Goal: 4 [facility] wants you to be able to say your pain was always managed very well.  Madison HickmanGREGORY,Daquann Merriott 04/05/2017

## 2017-04-05 NOTE — Anesthesia Preprocedure Evaluation (Signed)
Anesthesia Evaluation  Patient identified by MRN, date of birth, ID band Patient awake    Reviewed: Allergy & Precautions, NPO status , Patient's Chart, lab work & pertinent test results  Airway Mallampati: II   Neck ROM: full    Dental   Pulmonary Current Smoker,    breath sounds clear to auscultation       Cardiovascular negative cardio ROS   Rhythm:regular Rate:Normal     Neuro/Psych PSYCHIATRIC DISORDERS Anxiety Depression    GI/Hepatic GERD  ,(+)     substance abuse  ,   Endo/Other    Renal/GU      Musculoskeletal   Abdominal   Peds  Hematology   Anesthesia Other Findings   Reproductive/Obstetrics (+) Pregnancy                             Anesthesia Physical Anesthesia Plan  ASA: II  Anesthesia Plan: Epidural   Post-op Pain Management:    Induction: Intravenous  Airway Management Planned: Natural Airway  Additional Equipment:   Intra-op Plan:   Post-operative Plan:   Informed Consent: I have reviewed the patients History and Physical, chart, labs and discussed the procedure including the risks, benefits and alternatives for the proposed anesthesia with the patient or authorized representative who has indicated his/her understanding and acceptance.     Plan Discussed with: Anesthesiologist  Anesthesia Plan Comments:         Anesthesia Quick Evaluation

## 2017-04-06 NOTE — Progress Notes (Signed)
Post Partum Day #1 Subjective: no complaints, up ad lib, voiding, tolerating PO and reports normal lochia  Objective: Blood pressure (!) 102/55, pulse 63, temperature 98 F (36.7 C), temperature source Oral, resp. rate 16, height 4\' 11"  (1.499 m), weight 60.8 kg (134 lb), last menstrual period 06/15/2016, SpO2 100 %, unknown if currently breastfeeding.  Physical Exam:  General: alert Lochia: appropriate Uterine Fundus: firm and appropriately tender at U DVT Evaluation: No evidence of DVT seen on physical exam.   Recent Labs  04/05/17 0740  HGB 11.1*  HCT 32.6*    Assessment/Plan: Plan for discharge tomorrow  Bottle Plans BTL at [facility] in 6 weeks   LOS: 1 day   Jasher Barkan C Crecencio Kwiatek 04/06/2017, 6:35 AM

## 2017-04-06 NOTE — Clinical Social Work Maternal (Signed)
CLINICAL SOCIAL WORK MATERNAL/CHILD NOTE  Patient Details  Name: Caitlyn [facility]ings MRN: [id number] Date of Birth: 1981/04/19  Date:  04/06/2017  Clinical Social Worker Initiating Note:  Terri Piedra, [place] Date/ Time Initiated:  04/06/17/1400     Child's Name:  Caitlyn [facility]    Legal Caitlyn:  Other (Comment) (Parents: Caitlyn [facility] and Caitlyn [facility])   Need for Interpreter:  None   Date of Referral:  04/06/17     Reason for Referral:  Current Substance Use/Substance Use During Pregnancy , Other (Comment) (Hx Anx/Dep)   Referral Source:  [facility]   Address:  [address] [place], [place] [address]  Phone number:  [phone]   Household Members:  Minor Children, Other (Comment) (MOB lives with her three other children, ages 60-Caitlyn Cummings, Caitlyn [facility]ings, 6-Caitlyn [facility], and their father (not FOB).)   Natural Supports (not living in the home):  Other (Comment), Friends, Parent (MOB reports that FOB, her mother, best friend and cousin are her greatest support people.)   Professional Supports: Organized support group (Comment) (MOB attends group therapy at [facility])   Employment:     Type of Work:     Education:      Museum/gallery curator Resources:  [employer]   Other Resources:      Cultural/Religious Considerations Which May Impact Care: None stated.  Strengths:  Ability to meet basic needs , Pediatrician chosen , Home prepared for child  ([facility])   Risk Factors/Current Problems:  Other (Comment), Mental Health Concerns  (Anx/Dep, Subutex)   Cognitive State:  Able to Concentrate , Alert , Linear Thinking , Insightful , Goal Oriented    Mood/Affect:  Calm , Comfortable , Interested    CSW Assessment: CSW met with MOB in her first floor room/105 to offer support and complete assessment due to hx of anx/dep and subutex use.  MOB was pleasant and receptive to CSW's visit.  She was easy to engage and open to talking about her situation. MOB states she had  kidney stones and took narcotics to stop the pain.  She states she didn't realize she was withdrawing until a friend told her what she thought it was when [facility] felt like she had the flu.  She states she started Subutex at that time, approximately 1.5 years ago.  She states she was weaning off when she found out she was pregnant and therefore couldn't continue to wean.  She states she feels well supported by Dr. Mirna [facility] (Subutex provider) and her group therapy at [facility], however, states that she would like individual counseling as well.  CSW provided her with resources in her area as well as the [facility] access number.  MOB was appreciative.  CSW provided education regarding PMADs and encouraged her to speak with her MD if she has concerns about her mental health at any time.  MOB agreed and denies hx after her other deliveries, but added that she is concerned since she is older now.  She states she is taking Zoloft and feels it helps her feel better.  She also has an rx for Xanax, but reports minimal use.  She estimates taking "8 or 9 the whole pregnancy."  CSW encouraged her to speak with Dr. Mirna [facility] if she feels she wants to resume Xanax to ensure safety.  She agreed.  CSW provided her with a New Mom Checklist as a way to self-evaluate during the postpartum period. MOB asked CSW when withdrawal in a baby usual starts.  CSW suggests she speak  with the pediatric group about this, but informed her that withdrawal typically occurs between 48-72 hours.  She said, "we're going home tomorrow," and stated concern that baby could start withdrawing after discharge.  CSW told her CSW would check with pediatric group, but that baby will most likely not discharge tomorrow as the staff want to monitor for signs of withdrawal.  She states no problems with staying and seemed relieved that baby would continue to be monitored.  She reports her children are currently with their father for as long  as needed.  CSW provided an NAS brochure for MOB to read and discussed withdrawal symptoms as well as non-pharmacological interventions that she can be doing.  CSW noted that MOB had the room dim and quiet and was allowing baby to sleep.  She is also keeping track of symptoms that she has noticed, like excessive sneezing and writing them down on log.  CSW commends her for this. MOB reports that she has good supports and everything she needs for baby at home.  She was aware of SIDS precautions as reviewed by CSW.   CSW informed MOB of [facility] drug screen policy due to subutex rx.  MOB stated understanding.  Baby's UDS is positive for Xanax, but MOB reports that she has a prescription.  CSW notes rx in medical record.  CSW will monitor CDS results and make CPS report accordingly. CSW spoke with pediatric staff to confirm that baby would remain in the [facility] past MOB's discharge and went back to MOB's room to confirm with her as well.  She thanked CSW.    CSW Plan/Description:  Information/Referral to [employer] , No Further Intervention Required/No Barriers to Discharge, Patient/Family Education     Alphonzo Cruise, [place] 04/06/2017, 4:19 PM

## 2017-04-06 NOTE — Anesthesia Postprocedure Evaluation (Signed)
Anesthesia Post Note  Patient: Caitlyn [facility]  Procedure(s) Performed: * No procedures listed *  Patient location during evaluation: Mother Baby Anesthesia Type: Epidural Level of consciousness: awake and alert and oriented Pain management: satisfactory to patient Vital Signs Assessment: post-procedure vital signs reviewed and stable Respiratory status: spontaneous breathing and nonlabored ventilation Cardiovascular status: stable Postop Assessment: no headache, no backache, no signs of nausea or vomiting, adequate PO intake and patient able to bend at knees (patient up walking) Anesthetic complications: no        Last Vitals:  Vitals:   04/06/17 0214 04/06/17 0500  BP: 133/62 (!) 102/55  Pulse: 62 63  Resp: 16 16  Temp: 36.7 C 36.7 C    Last Pain:  Vitals:   04/06/17 0715  TempSrc:   PainSc: 0-No pain   Pain Goal:                 Tamma Brigandi

## 2017-04-07 NOTE — Discharge Summary (Signed)
Obstetric Discharge Summary Reason for Admission: induction of labor Prenatal Procedures: none Intrapartum Procedures: spontaneous vaginal delivery Postpartum Procedures: none Complications-Operative and Postpartum: none  36 y.o. [id number] at [redacted]w[redacted]d delivered a viable female infant in cephalic, LOA position. no nuchal cord. Anterior shoulder delivered with ease. 60 sec delayed cord clamping. Cord clamped x2 and cut. Placenta delivered spontaneously intact, with 3VC. Fundus firm on exam with massage and pitocin. Good hemostasis noted.  Anesthesia: Epidural Laceration: No sutures required Good hemostasis noted. EBL: 100 cc  Mom and baby recovering in LDR.    Apgars and Weight pending.  Sponge and instrument count were correct x2. Placenta sent to L&D.  Hospital Course:  Active Problems:   Post-dates pregnancy   Caitlyn Cummings is a 36 y.o. [id number] s/p SVD.  Patient was admitted for post-dates IOL.  She has postpartum course that was uncomplicated including no problems with ambulating, PO intake, urination, pain, or bleeding. The pt feels ready to go home and  will be discharged with outpatient follow-up.   Today: No acute events overnight.  Pt denies problems with ambulating, voiding or po intake.  She denies nausea or vomiting.  Pain is well controlled.  She has had flatus. She has had bowel movement.  Lochia Moderate.  Plan for birth control is  bilateral tubal ligation.  Method of Feeding: Formula  Physical Exam:  General: alert, cooperative and no distress Lochia: appropriate Uterine Fundus: firm DVT Evaluation: No evidence of DVT seen on physical exam.  H/H: Lab Results  Component Value Date/Time   HGB 11.1 (L) 04/05/2017 07:40 AM   HCT 32.6 (L) 04/05/2017 07:40 AM   HCT 33.0 (L) 01/22/2017 09:15 AM    Discharge Diagnoses: Post-date pregnancy delivered  Discharge Information: Date: 04/07/2017 Activity: pelvic rest Diet: routine  Medications: PNV Breast feeding:   No: Formula feeding Condition: stable Instructions: refer to handout Discharge to: home   Discharge Instructions    Call MD for:  difficulty breathing, headache or visual disturbances    Complete by:  As directed    Call MD for:  extreme fatigue    Complete by:  As directed    Call MD for:  hives    Complete by:  As directed    Call MD for:  persistant dizziness or light-headedness    Complete by:  As directed    Call MD for:  persistant nausea and vomiting    Complete by:  As directed    Call MD for:  redness, tenderness, or signs of infection (pain, swelling, redness, odor or green/yellow discharge around incision site)    Complete by:  As directed    Call MD for:  severe uncontrolled pain    Complete by:  As directed    Call MD for:  temperature >100.4    Complete by:  As directed    Driving restriction     Complete by:  As directed    Avoid driving for at least 2 weeks.   Lifting restrictions    Complete by:  As directed    Weight restriction of 10 lbs.   Sexual acrtivity    Complete by:  As directed    Pelvic rest (no sex or tampons) for six weeks     Allergies as of 04/07/2017      Reactions   Tamiflu [oseltamivir Phosphate] Hives   Ultram [tramadol Hcl] Nausea And Vomiting      Medication List    TAKE these medications   ALPRAZolam  1 MG tablet Commonly known as:  XANAX Take 1 mg by mouth daily as needed for anxiety.   buprenorphine 8 MG Subl SL tablet Commonly known as:  SUBUTEX Place 8 mg under the tongue 3 (three) times daily.   FLINSTONES GUMMIES OMEGA-3 DHA PO Take 2 each by mouth daily. Takes 2 daily   ranitidine 150 MG tablet Commonly known as:  ZANTAC Take 150 mg by mouth 2 (two) times daily.   sertraline 25 MG tablet Commonly known as:  ZOLOFT Take 1 tablet (25 mg total) by mouth daily. What changed:  when to take this        Tarri AbernethyAbigail J Lancaster ,MD 04/07/2017,7:10 AM  CNM attestation I have seen and examined this patient and agree  with above documentation in the resident's note.   Caitlyn Cummings is a 36 y.o. [id number] s/p SVD.   Pain is well controlled.  Plan for birth control is bilateral tubal ligation- interval.  Method of Feeding: bottle  PE:  BP (!) 112/57   Pulse 69   Temp 98.2 F (36.8 C) (Oral)   Resp 18   Ht 4\' 11"  (1.499 m)   Wt 60.8 kg (134 lb)   LMP 06/15/2016 (Approximate)   SpO2 100%   Breastfeeding? Unknown   BMI 27.06 kg/m  Fundus firm   Recent Labs  04/05/17 0740  HGB 11.1*  HCT 32.6*     Plan: discharge today- infant will most likely stay for NAS obs - postpartum care discussed - f/u clinic for timing of interval BTL and PP visit   Cam HaiSHAW, Kay Shippy, CNM 8:55 AM  04/07/2017

## 2017-04-07 NOTE — Discharge Instructions (Signed)

## 2017-05-03 ENCOUNTER — Encounter: Payer: Self-pay | Admitting: Advanced Practice Midwife

## 2017-05-03 ENCOUNTER — Ambulatory Visit: Payer: Medicaid Other | Admitting: Advanced Practice Midwife

## 2017-05-31 ENCOUNTER — Encounter: Payer: Self-pay | Admitting: *Deleted

## 2017-05-31 ENCOUNTER — Ambulatory Visit: Payer: Medicaid Other | Admitting: Advanced Practice Midwife

## 2017-11-05 IMAGING — DX DG CHEST 2V
2 series · 2 of 2 positions shown · non-contrast
Comparison: None.

CLINICAL DATA: Pt states she is having pain under her breasts today
with generalized abdominal pain that radiates to back. C/o nausea as
well.

EXAM:
CHEST  2 VIEW

[chest pa]
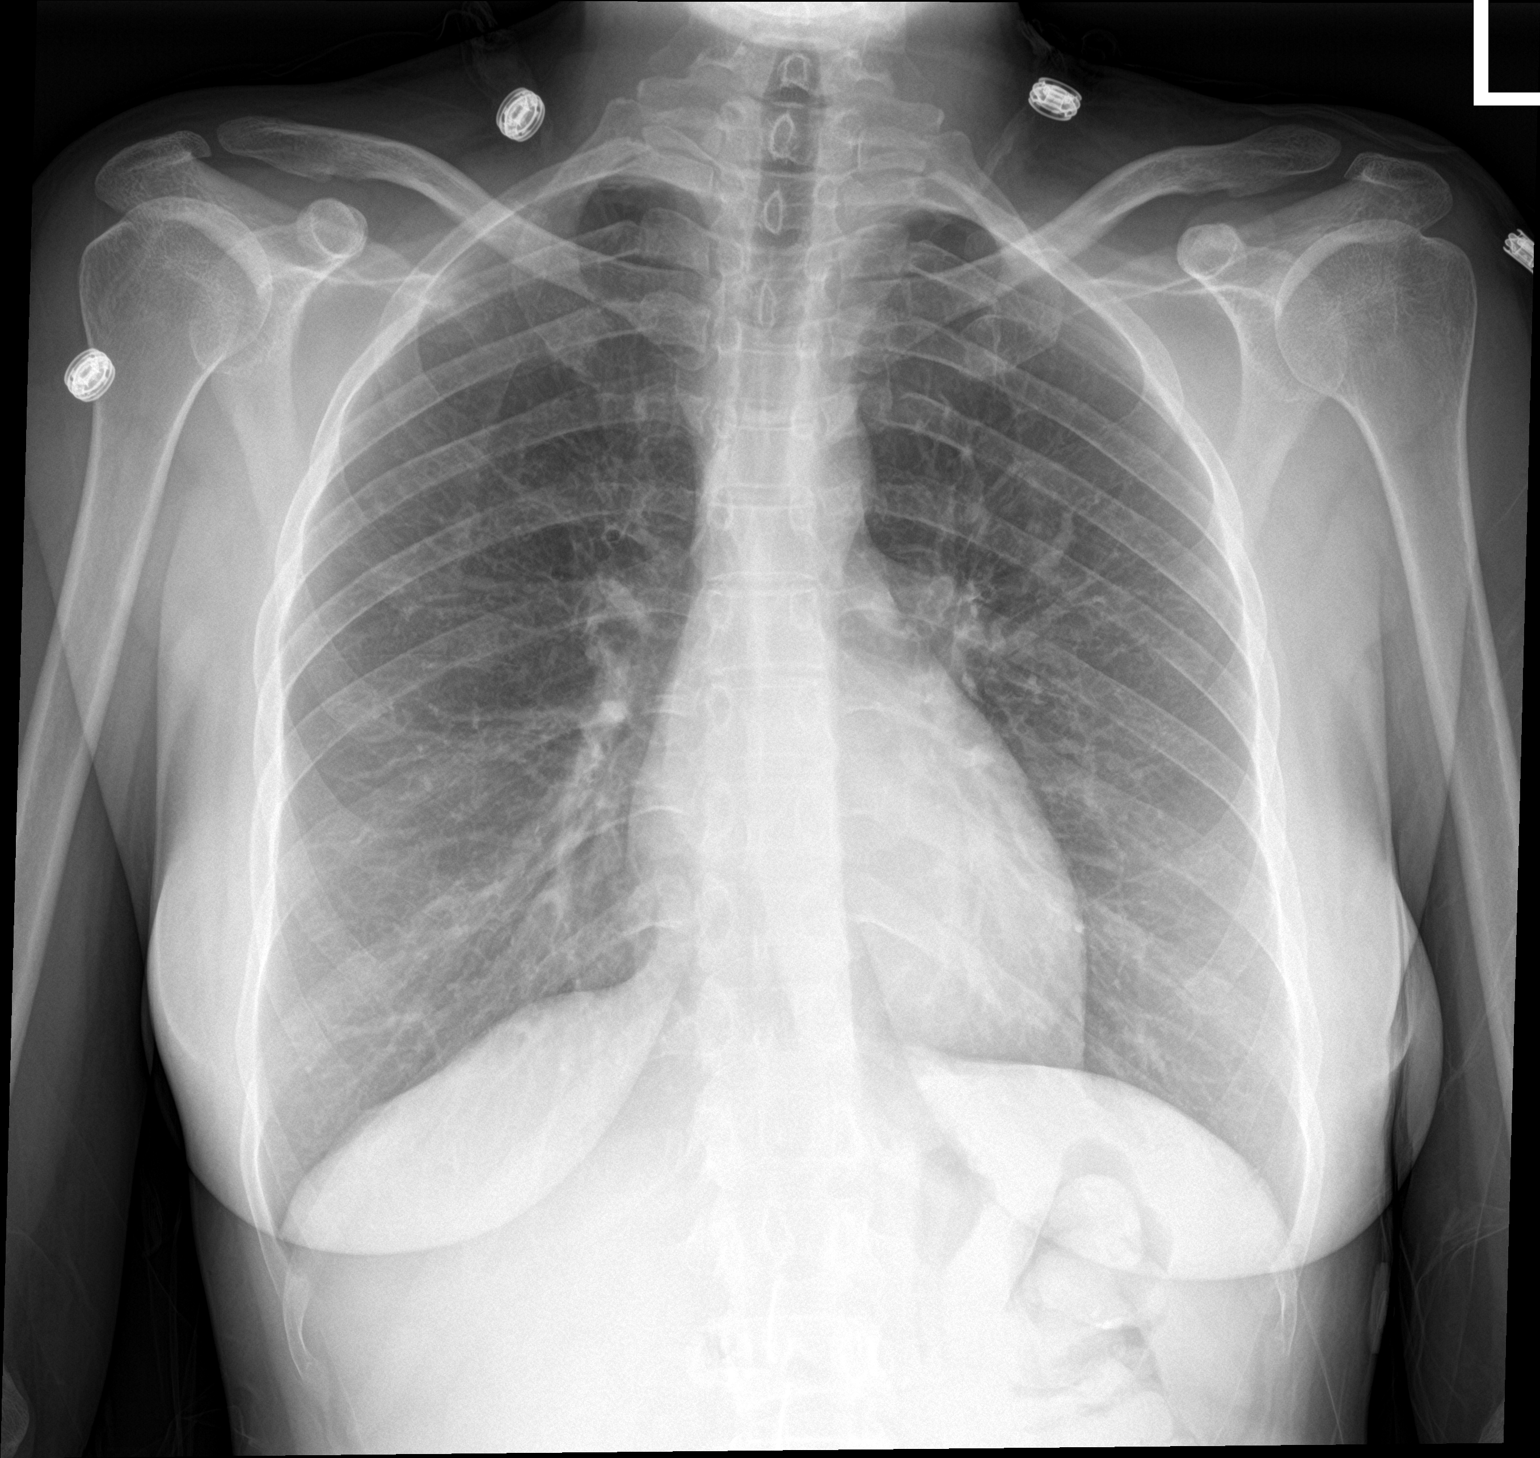

[chest lat]
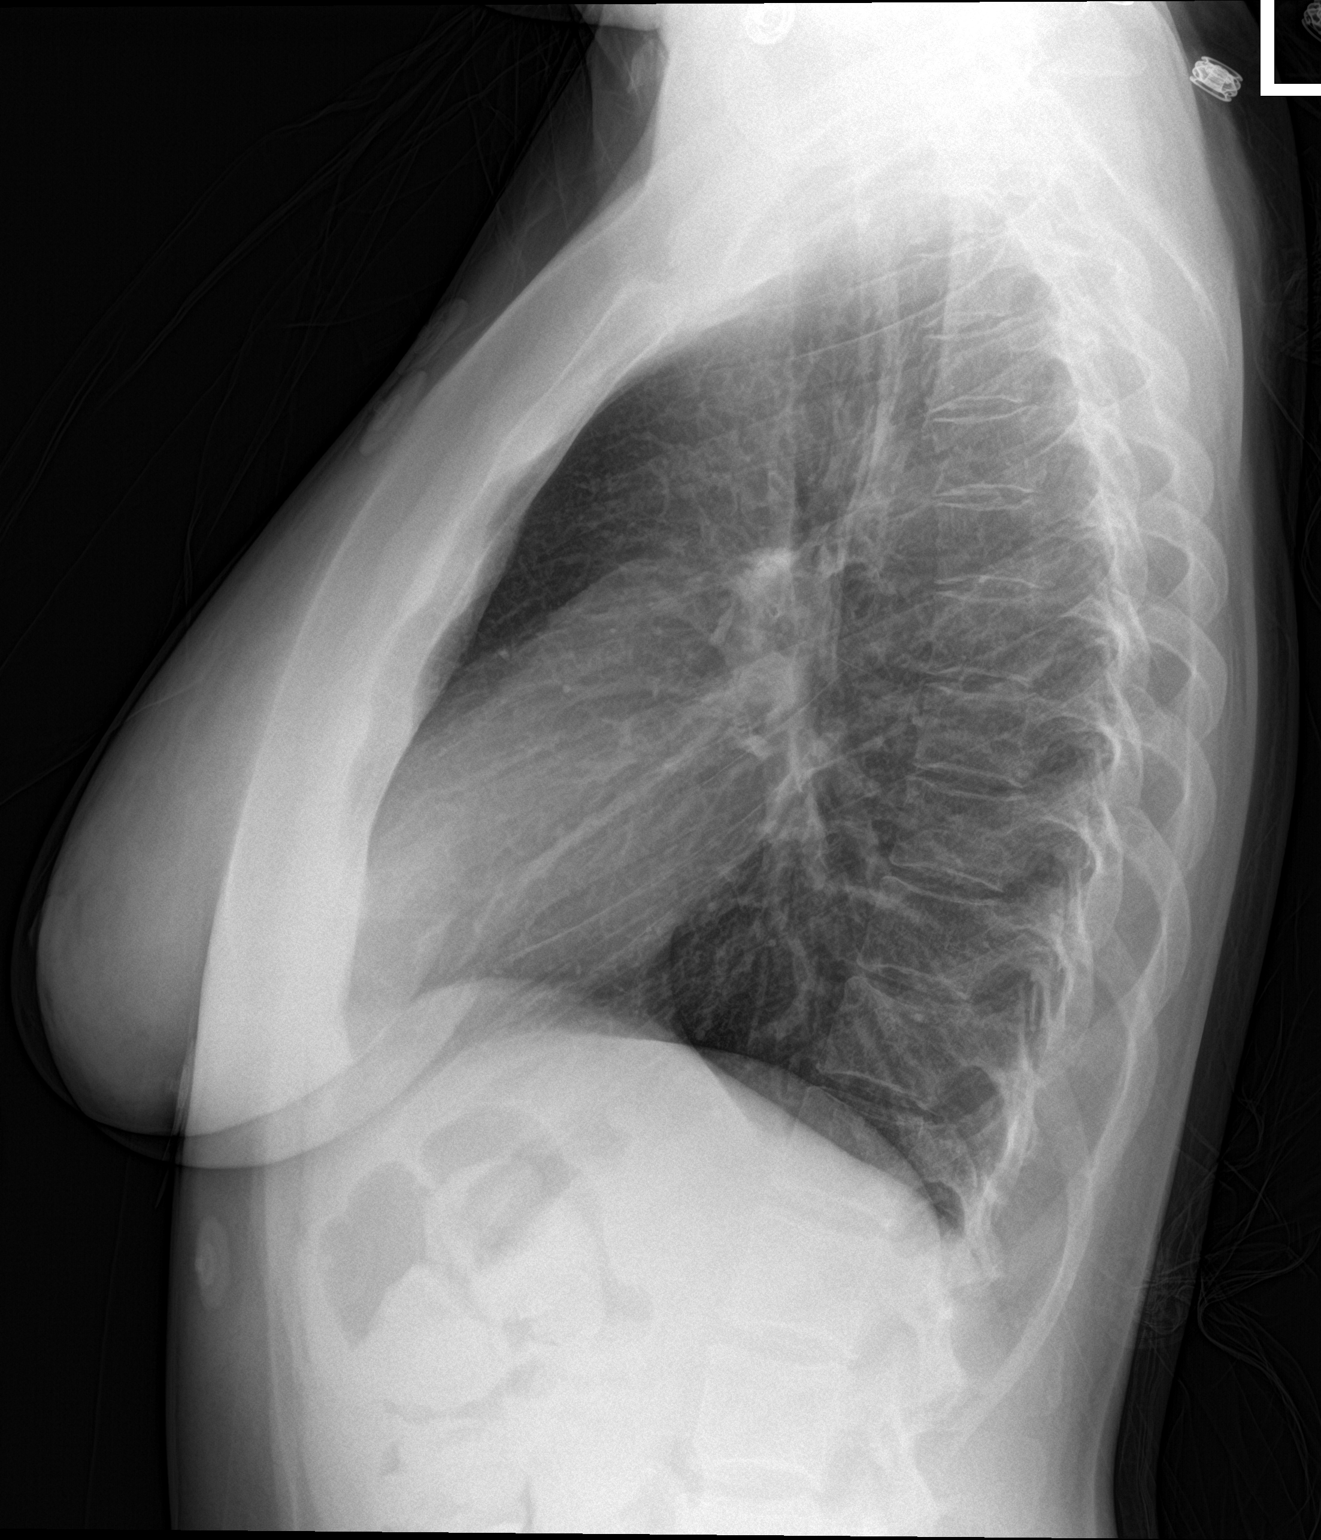

[2 of 2 positions shown; findings below may reference images not displayed]

FINDINGS: The heart size and mediastinal contours are within normal limits.
Both lungs are clear. No pleural effusion or pneumothorax. The
visualized skeletal structures are unremarkable.
IMPRESSION: Normal chest radiographs.

## 2018-03-27 ENCOUNTER — Other Ambulatory Visit: Payer: Self-pay | Admitting: *Deleted

## 2018-03-27 ENCOUNTER — Encounter: Payer: Self-pay | Admitting: *Deleted

## 2018-04-04 ENCOUNTER — Ambulatory Visit: Payer: Self-pay | Admitting: *Deleted

## 2018-04-04 ENCOUNTER — Encounter: Payer: Self-pay | Admitting: Women's Health

## 2018-04-30 ENCOUNTER — Ambulatory Visit: Payer: Medicaid Other | Admitting: *Deleted

## 2018-04-30 ENCOUNTER — Encounter: Payer: Medicaid Other | Admitting: Advanced Practice Midwife

## 2018-05-20 ENCOUNTER — Other Ambulatory Visit (HOSPITAL_COMMUNITY)
Admission: RE | Admit: 2018-05-20 | Discharge: 2018-05-20 | Disposition: A | Discharge status: Home / Self Care | Payer: Medicaid Other | Source: Ambulatory Visit | Attending: Obstetrics & Gynecology | Admitting: Obstetrics & Gynecology

## 2018-05-20 ENCOUNTER — Ambulatory Visit (INDEPENDENT_AMBULATORY_CARE_PROVIDER_SITE_OTHER): Payer: Medicaid Other | Admitting: Women's Health

## 2018-05-20 ENCOUNTER — Encounter: Payer: Self-pay | Admitting: Women's Health

## 2018-05-20 ENCOUNTER — Ambulatory Visit: Payer: Medicaid Other | Admitting: *Deleted

## 2018-05-20 VITALS — BP 104/56 | HR 74 | Wt 110.0 lb

## 2018-05-20 DIAGNOSIS — F1120 Opioid dependence, uncomplicated: Secondary | ICD-10-CM

## 2018-05-20 DIAGNOSIS — O99320 Drug use complicating pregnancy, unspecified trimester: Secondary | ICD-10-CM

## 2018-05-20 DIAGNOSIS — F17210 Nicotine dependence, cigarettes, uncomplicated: Secondary | ICD-10-CM

## 2018-05-20 DIAGNOSIS — O0930 Supervision of pregnancy with insufficient antenatal care, unspecified trimester: Secondary | ICD-10-CM

## 2018-05-20 DIAGNOSIS — O99322 Drug use complicating pregnancy, second trimester: Secondary | ICD-10-CM | POA: Diagnosis not present

## 2018-05-20 DIAGNOSIS — Z3482 Encounter for supervision of other normal pregnancy, second trimester: Secondary | ICD-10-CM | POA: Diagnosis not present

## 2018-05-20 DIAGNOSIS — O0932 Supervision of pregnancy with insufficient antenatal care, second trimester: Secondary | ICD-10-CM | POA: Diagnosis not present

## 2018-05-20 DIAGNOSIS — Z3490 Encounter for supervision of normal pregnancy, unspecified, unspecified trimester: Secondary | ICD-10-CM | POA: Insufficient documentation

## 2018-05-20 DIAGNOSIS — Z3A26 26 weeks gestation of pregnancy: Secondary | ICD-10-CM

## 2018-05-20 DIAGNOSIS — Z124 Encounter for screening for malignant neoplasm of cervix: Secondary | ICD-10-CM

## 2018-05-20 DIAGNOSIS — Z1389 Encounter for screening for other disorder: Secondary | ICD-10-CM

## 2018-05-20 DIAGNOSIS — Z363 Encounter for antenatal screening for malformations: Secondary | ICD-10-CM

## 2018-05-20 DIAGNOSIS — F418 Other specified anxiety disorders: Secondary | ICD-10-CM

## 2018-05-20 DIAGNOSIS — F17200 Nicotine dependence, unspecified, uncomplicated: Secondary | ICD-10-CM

## 2018-05-20 DIAGNOSIS — Z331 Pregnant state, incidental: Secondary | ICD-10-CM

## 2018-05-20 LAB — POCT URINALYSIS DIPSTICK
Glucose, UA: NEGATIVE
Ketones, UA: NEGATIVE
LEUKOCYTES UA: NEGATIVE
NITRITE UA: NEGATIVE
PROTEIN UA: POSITIVE — AB
RBC UA: NEGATIVE

## 2018-05-20 NOTE — Progress Notes (Signed)
INITIAL OBSTETRICAL VISIT Patient name: Caitlyn Cummings MRN [id number]  Date of birth: 24-Jan-1981 Chief Complaint:   Initial Prenatal Visit  History of Present Illness:   Caitlyn Cummings is a 37 y.o. [id number] Caucasian female at [redacted]w[redacted]d by 9wk u/s in [place], [place] with an Estimated Date of Delivery: 08/20/18 being seen today for her initial obstetrical visit.   Her obstetrical history is significant for term uncomplicated svb x 4, Ab x 1.  Late care @ 26wks, states she has had a lot going on in family w/ sicknesses, accidents, etc. Wanted BTL w/ last pregnancy in 2018, however consent wasn't 30d yet, plan was for interval BTL, never came for ppv. Wants BTL this time. Dep/anx currently on zoloft, lamictal, Xanax 1mg  1-2x/day, Subutex 8mg  TID from Dr. Rubye Oaks in [place], goes 1x/mth.  Smoking >1ppd prior to pregnancy, now 12-14/day and feels she can quit on her own.  Today she reports no complaints.  Patient's last menstrual period was 10/27/2017. Last pap unsure. Results were: normal Review of Systems:   Pertinent items are noted in HPI Denies cramping/contractions, leakage of fluid, vaginal bleeding, abnormal vaginal discharge w/ itching/odor/irritation, headaches, visual changes, shortness of breath, chest pain, abdominal pain, severe nausea/vomiting, or problems with urination or bowel movements unless otherwise stated above.  Pertinent History Reviewed:  Reviewed past medical,surgical, social, obstetrical and family history.  Reviewed problem list, medications and allergies. OB History  Gravida Para Term Preterm AB Living  6 4 4   1 4   SAB TAB Ectopic Multiple Live Births  0     0 4    # Outcome Date GA Lbr Len/2nd Weight Sex Delivery Anes PTL Lv  6 Current           5 Term 04/05/17 [redacted]w[redacted]d 06:07 / 00:36 7 lb 0.7 oz (3.195 kg) F Vag-Spont EPI N LIV  4 Term 01/26/11 [redacted]w[redacted]d  6 lb 3 oz (2.807 kg) M Vag-Spont EPI N LIV  3 Term 08/12/08 [redacted]w[redacted]d  6 lb 8 oz (2.948 kg) F Vag-Spont EPI N LIV  2 Term  06/15/06 [redacted]w[redacted]d  5 lb 10 oz (2.551 kg) F Vag-Spont EPI N LIV  1 AB            Physical Assessment:   Vitals:   05/20/18 1005  BP: (!) 104/56  Pulse: 74  Weight: 110 lb (49.9 kg)  Body mass index is 22.22 kg/m.       Physical Examination:  General appearance - well appearing, and in no distress  Mental status - alert, oriented to person, place, and time  Psych:  She has a normal mood and affect  Skin - warm and dry, normal color, no suspicious lesions noted  Chest - effort normal, all lung fields clear to auscultation bilaterally  Heart - normal rate and regular rhythm  Abdomen - soft, nontender  Extremities:  No swelling or varicosities noted  Pelvic - VULVA: normal appearing vulva with no masses, tenderness or lesions  VAGINA: normal appearing vagina with normal color and discharge, no lesions  CERVIX: normal appearing cervix without discharge or lesions, no CMT  Thin prep pap is done w/ HR HPV cotesting  Fetal Heart Rate (bpm): 142 via doppler FH 24cm  Results for orders placed or performed in visit on 05/20/18 (from the past 24 hour(s))  POCT urinalysis dipstick   Collection Time: 05/20/18 10:24 AM  Result Value Ref Range   Color, UA     Clarity, UA  Glucose, UA Negative Negative   Bilirubin, UA     Ketones, UA neg    Spec Grav, UA  1.010 - 1.025   Blood, UA neg    pH, UA  5.0 - 8.0   Protein, UA Positive (A) Negative   Urobilinogen, UA  0.2 or 1.0 E.U./dL   Nitrite, UA neg    Leukocytes, UA Negative Negative   Appearance     Odor      Assessment & Plan:  1) Low-Risk Pregnancy [id number] at 7364w6d with an Estimated Date of Delivery: 08/20/18   2) Initial OB visit  3) Dep/anx> currently on zoloft, lamictal, xanax 1mg  1-2x/day, discussed xanax not recommended during pregnancy, advised slowly weaning  4) Subutex therapy> 8mg  TID by [place]. [place]mer [place], goes 1x/mth, declines NAS/NICU tour  5) Late care  6) Smoker> Smokes 12-14/day, down from >1ppd counseled x  3-4510mins, advised cessation, discussed risks to fetus while pregnant, to infant pp, and to herself. Offered QuitlineNC, declined- feels she can quit on her own   7) Desires BTL> discussed risks/benefits, consent signed today  Meds: No orders of the defined types were placed in this encounter.   Initial labs obtained Continue prenatal vitamins Reviewed ptl s/s, fkc Reviewed recommended weight gain based on pre-gravid BMI Encouraged well-balanced diet Genetic Screening discussed: too late Cystic fibrosis screening discussed declined Ultrasound discussed; fetal survey: requested CCNC completed>spoke w/ [place]  Follow-up: Return for next week for anatomy u/s and sugar test (no visit), then 4wks for LROB, Sign BTL consent today.   Orders Placed This Encounter  Procedures  . Urine Culture  . US OB Comp + 14 Wk  . Obstetric Panel, Including HIV  . Urinalysis, Routine w reflex microscopic  . Pain Management Screening Profile (10S)  . POCT urinalysis dipstick    Cheral MarkerKimberly R Zanayah Shadowens CNM, [facility]-BC 05/20/2018 11:19 AM

## 2018-05-20 NOTE — Patient Instructions (Signed)
Caitlyn Cummings, I greatly value your feedback.  If you receive a survey following your visit with us today, we appreciate you taking the time to fill it out.  Thanks, Joellyn HaffKim Chloe Baig, CNM, WHNP-BC   You will have your sugar test next visit.  Please do not eat or drink anything after midnight the night before you come, not even water.  You will be here for at least two hours.     Call the office [phone]) or go to [facility] if:  You begin to have strong, frequent contractions  Your water breaks.  Sometimes it is a big gush of fluid, sometimes it is just a trickle that keeps getting your panties wet or running down your legs  You have vaginal bleeding.  It is normal to have a small amount of spotting if your cervix was checked.   You don't feel your baby moving like normal.  If you don't, get you something to eat and drink and lay down and focus on feeling your baby move.   If your baby is still not moving like normal, you should call the office or go to [facility].  Second Trimester of Pregnancy The second trimester is from week 13 through week 28, months 4 through 6. The second trimester is often a time when you feel your best. Your body has also adjusted to being pregnant, and you begin to feel better physically. Usually, morning sickness has lessened or quit completely, you may have more energy, and you may have an increase in appetite. The second trimester is also a time when the fetus is growing rapidly. At the end of the sixth month, the fetus is about 9 inches long and weighs about 1 pounds. You will likely begin to feel the baby move (quickening) between 18 and 20 weeks of the pregnancy. BODY CHANGES Your body goes through many changes during pregnancy. The changes vary from woman to woman.   Your weight will continue to increase. You will notice your lower abdomen bulging out.  You may begin to get stretch marks on your hips, abdomen, and breasts.  You may develop headaches  that can be relieved by medicines approved by your health care provider.  You may urinate more often because the fetus is pressing on your bladder.  You may develop or continue to have heartburn as a result of your pregnancy.  You may develop constipation because certain hormones are causing the muscles that push waste through your intestines to slow down.  You may develop hemorrhoids or swollen, bulging veins (varicose veins).  You may have back pain because of the weight gain and pregnancy hormones relaxing your joints between the bones in your pelvis and as a result of a shift in weight and the muscles that support your balance.  Your breasts will continue to grow and be tender.  Your gums may bleed and may be sensitive to brushing and flossing.  Dark spots or blotches (chloasma, mask of pregnancy) may develop on your face. This will likely fade after the baby is born.  A dark line from your belly button to the pubic area (linea nigra) may appear. This will likely fade after the baby is born.  You may have changes in your hair. These can include thickening of your hair, rapid growth, and changes in texture. Some women also have hair loss during or after pregnancy, or hair that feels dry or thin. Your hair will most likely return to normal after your  baby is born. WHAT TO EXPECT AT YOUR PRENATAL VISITS During a routine prenatal visit:  You will be weighed to make sure you and the fetus are growing normally.  Your blood pressure will be taken.  Your abdomen will be measured to track your baby's growth.  The fetal heartbeat will be listened to.  Any test results from the previous visit will be discussed. Your health care provider may ask you:  How you are feeling.  If you are feeling the baby move.  If you have had any abnormal symptoms, such as leaking fluid, bleeding, severe headaches, or abdominal cramping.  If you have any questions. Other tests that may be performed  during your second trimester include:  Blood tests that check for:  Low iron levels (anemia).  Gestational diabetes (between 24 and 28 weeks).  Rh antibodies.  Urine tests to check for infections, diabetes, or protein in the urine.  An ultrasound to confirm the proper growth and development of the baby.  An amniocentesis to check for possible genetic problems.  Fetal screens for spina bifida and Down syndrome. HOME CARE INSTRUCTIONS   Avoid all smoking, herbs, alcohol, and unprescribed drugs. These chemicals affect the formation and growth of the baby.  Follow your health care provider's instructions regarding medicine use. There are medicines that are either safe or unsafe to take during pregnancy.  Exercise only as directed by your health care provider. Experiencing uterine cramps is a good sign to stop exercising.  Continue to eat regular, healthy meals.  Wear a good support bra for breast tenderness.  Do not use hot tubs, steam rooms, or saunas.  Wear your seat belt at all times when driving.  Avoid raw meat, uncooked cheese, cat litter boxes, and soil used by cats. These carry germs that can cause birth defects in the baby.  Take your prenatal vitamins.  Try taking a stool softener (if your health care provider approves) if you develop constipation. Eat more high-fiber foods, such as fresh vegetables or fruit and whole grains. Drink plenty of fluids to keep your urine clear or pale yellow.  Take warm sitz baths to soothe any pain or discomfort caused by hemorrhoids. Use hemorrhoid cream if your health care provider approves.  If you develop varicose veins, wear support hose. Elevate your feet for 15 minutes, 3-4 times a day. Limit salt in your diet.  Avoid heavy lifting, wear low heel shoes, and practice good posture.  Rest with your legs elevated if you have leg cramps or low back pain.  Visit your dentist if you have not gone yet during your pregnancy. Use a soft  toothbrush to brush your teeth and be gentle when you floss.  A sexual relationship may be continued unless your health care provider directs you otherwise.  Continue to go to all your prenatal visits as directed by your health care provider. SEEK MEDICAL CARE IF:   You have dizziness.  You have mild pelvic cramps, pelvic pressure, or nagging pain in the abdominal area.  You have persistent nausea, vomiting, or diarrhea.  You have a bad smelling vaginal discharge.  You have pain with urination. SEEK IMMEDIATE MEDICAL CARE IF:   You have a fever.  You are leaking fluid from your vagina.  You have spotting or bleeding from your vagina.  You have severe abdominal cramping or pain.  You have rapid weight gain or loss.  You have shortness of breath with chest pain.  You notice sudden or extreme  swelling of your face, hands, ankles, feet, or legs.  You have not felt your baby move in over an hour.  You have severe headaches that do not go away with medicine.  You have vision changes. Document Released: 10/24/2001 Document Revised: 11/04/2013 Document Reviewed: 12/31/2012 [facility] Patient Information 2015 [place], [place]. This information is not intended to replace advice given to you by your health care provider. Make sure you discuss any questions you have with your health care provider.

## 2018-05-21 LAB — URINALYSIS, ROUTINE W REFLEX MICROSCOPIC
Bilirubin, UA: NEGATIVE
Glucose, UA: NEGATIVE
NITRITE UA: NEGATIVE
RBC, UA: NEGATIVE
Specific Gravity, UA: 1.024 (ref 1.005–1.030)
Urobilinogen, Ur: 0.2 mg/dL (ref 0.2–1.0)
pH, UA: 5.5 (ref 5.0–7.5)

## 2018-05-21 LAB — OBSTETRIC PANEL, INCLUDING HIV
ANTIBODY SCREEN: NEGATIVE
BASOS: 0 %
Basophils Absolute: 0 10*3/uL (ref 0.0–0.2)
EOS (ABSOLUTE): 0.1 10*3/uL (ref 0.0–0.4)
EOS: 1 %
HEMATOCRIT: 33.4 % — AB (ref 34.0–46.6)
HEMOGLOBIN: 11.1 g/dL (ref 11.1–15.9)
HIV Screen 4th Generation wRfx: NONREACTIVE
Hepatitis B Surface Ag: NEGATIVE
IMMATURE GRANS (ABS): 0.1 10*3/uL (ref 0.0–0.1)
Immature Granulocytes: 1 %
LYMPHS: 21 %
Lymphocytes Absolute: 2.7 10*3/uL (ref 0.7–3.1)
MCH: 30.5 pg (ref 26.6–33.0)
MCHC: 33.2 g/dL (ref 31.5–35.7)
MCV: 92 fL (ref 79–97)
MONOCYTES: 6 %
Monocytes Absolute: 0.7 10*3/uL (ref 0.1–0.9)
Neutrophils Absolute: 9.3 10*3/uL — ABNORMAL HIGH (ref 1.4–7.0)
Neutrophils: 71 %
Platelets: 243 10*3/uL (ref 150–450)
RBC: 3.64 x10E6/uL — ABNORMAL LOW (ref 3.77–5.28)
RDW: 14.7 % (ref 12.3–15.4)
RH TYPE: POSITIVE
RPR: NONREACTIVE
RUBELLA: 2.05 {index} (ref >0.99)
WBC: 12.9 10*3/uL — ABNORMAL HIGH (ref 3.4–10.8)

## 2018-05-21 LAB — PMP SCREEN PROFILE (10S), URINE
Amphetamine Scrn, Ur: NEGATIVE ng/mL
BARBITURATE SCREEN URINE: NEGATIVE ng/mL
BENZODIAZEPINE SCREEN, URINE: POSITIVE ng/mL — AB
CANNABINOIDS UR QL SCN: NEGATIVE ng/mL
Cocaine (Metab) Scrn, Ur: NEGATIVE ng/mL
Creatinine(Crt), U: 186.7 mg/dL (ref 20.0–300.0)
METHADONE SCREEN, URINE: NEGATIVE ng/mL
OXYCODONE+OXYMORPHONE UR QL SCN: NEGATIVE ng/mL
Opiate Scrn, Ur: NEGATIVE ng/mL
PH UR, DRUG SCRN: 5.8 (ref 4.5–8.9)
Phencyclidine Qn, Ur: NEGATIVE ng/mL
Propoxyphene Scrn, Ur: NEGATIVE ng/mL

## 2018-05-21 LAB — CYTOLOGY - PAP
Chlamydia: NEGATIVE
DIAGNOSIS: NEGATIVE
HPV (WINDOPATH): NOT DETECTED
NEISSERIA GONORRHEA: NEGATIVE

## 2018-05-21 LAB — MICROSCOPIC EXAMINATION
Casts: NONE SEEN /lpf
Epithelial Cells (non renal): >10 /hpf — AB (ref 0–10)

## 2018-05-21 LAB — MED LIST OPTION NOT SELECTED

## 2018-05-22 LAB — URINE CULTURE

## 2018-05-28 ENCOUNTER — Other Ambulatory Visit: Payer: Medicaid Other

## 2018-05-29 ENCOUNTER — Ambulatory Visit (INDEPENDENT_AMBULATORY_CARE_PROVIDER_SITE_OTHER): Payer: Medicaid Other

## 2018-05-29 DIAGNOSIS — Z363 Encounter for antenatal screening for malformations: Secondary | ICD-10-CM

## 2018-05-29 DIAGNOSIS — Z3482 Encounter for supervision of other normal pregnancy, second trimester: Secondary | ICD-10-CM

## 2018-05-29 NOTE — Progress Notes (Signed)
US 28+1 wks,cephalic,cx 3.5 cm,posterior pl gr 2,normal ovaries bilat,afi 13 cm,fhr 137 bpm,EFW 1100 g 21%,anatomy complete,no obvious abnormalities

## 2018-05-30 ENCOUNTER — Other Ambulatory Visit: Payer: Medicaid Other

## 2018-06-17 ENCOUNTER — Telehealth: Payer: Self-pay | Admitting: Obstetrics & Gynecology

## 2018-06-17 ENCOUNTER — Encounter: Payer: Medicaid Other | Admitting: Women's Health

## 2018-06-17 NOTE — Telephone Encounter (Signed)
Called patient back and left message that I am returning her call.  

## 2018-06-20 ENCOUNTER — Ambulatory Visit (INDEPENDENT_AMBULATORY_CARE_PROVIDER_SITE_OTHER): Payer: Medicaid Other | Admitting: Women's Health

## 2018-06-20 ENCOUNTER — Encounter: Payer: Self-pay | Admitting: Women's Health

## 2018-06-20 VITALS — BP 94/55 | HR 69 | Wt 111.5 lb

## 2018-06-20 DIAGNOSIS — L292 Pruritus vulvae: Secondary | ICD-10-CM | POA: Diagnosis not present

## 2018-06-20 DIAGNOSIS — Z3A31 31 weeks gestation of pregnancy: Secondary | ICD-10-CM

## 2018-06-20 DIAGNOSIS — F418 Other specified anxiety disorders: Secondary | ICD-10-CM

## 2018-06-20 DIAGNOSIS — Z1389 Encounter for screening for other disorder: Secondary | ICD-10-CM

## 2018-06-20 DIAGNOSIS — Z3483 Encounter for supervision of other normal pregnancy, third trimester: Secondary | ICD-10-CM

## 2018-06-20 DIAGNOSIS — O26893 Other specified pregnancy related conditions, third trimester: Secondary | ICD-10-CM

## 2018-06-20 DIAGNOSIS — Z331 Pregnant state, incidental: Secondary | ICD-10-CM

## 2018-06-20 LAB — POCT URINALYSIS DIPSTICK OB
Blood, UA: NEGATIVE
Glucose, UA: NEGATIVE — AB
KETONES UA: NEGATIVE
Leukocytes, UA: NEGATIVE
NITRITE UA: NEGATIVE

## 2018-06-20 LAB — POCT WET PREP (WET MOUNT)
CLUE CELLS WET PREP WHIFF POC: NEGATIVE
TRICHOMONAS WET PREP HPF POC: ABSENT

## 2018-06-20 NOTE — Progress Notes (Signed)
LOW-RISK PREGNANCY VISIT Patient name: Caitlyn Cummings MRN [id number]  Date of birth: 1981/09/01 Chief Complaint:   Routine Prenatal Visit (yeast infection from antibiotic)  History of Present Illness:   Caitlyn Cummings is a 37 y.o. [id number] female at 4189w2d with an Estimated Date of Delivery: 08/20/18 being seen today for ongoing management of a low-risk pregnancy.  Today she reports has tooth abscess, currently on antibiotics from dentist, goes to oral surgeon to have pulled on 8/20. Thinks she has yeast infection, has vulvovaginal itching w/ d/c, no odor. Hasn't done PN2 yet. Has slowly weaned herself down from xanax 1mg  1-2x/day to 0.5mg  qhs. Plans to wean to 0.25mg  qhs. Contractions: Irregular. Vag. Bleeding: None.  Movement: Present. denies leaking of fluid. Review of Systems:   Pertinent items are noted in HPI Denies abnormal vaginal discharge w/ itching/odor/irritation, headaches, visual changes, shortness of breath, chest pain, abdominal pain, severe nausea/vomiting, or problems with urination or bowel movements unless otherwise stated above. Pertinent History Reviewed:  Reviewed past medical,surgical, social, obstetrical and family history.  Reviewed problem list, medications and allergies. Physical Assessment:   Vitals:   06/20/18 1107  BP: (!) 94/55  Pulse: 69  Weight: 111 lb 8 oz (50.6 kg)  Body mass index is 22.52 kg/m.        Physical Examination:   General appearance: Well appearing, and in no distress  Mental status: Alert, oriented to person, place, and time  Skin: Warm & dry  Cardiovascular: Normal heart rate noted  Respiratory: Normal respiratory effort, no distress  Abdomen: Soft, gravid, nontender  Pelvic: vulva erythematous, spec exam: cx visually closed, small amt white nonodorous d/c         Extremities: Edema: None  Fetal Status: Fetal Heart Rate (bpm): 135 Fundal Height: 29 cm Movement: Present    Results for orders placed or performed in visit on 06/20/18  (from the past 24 hour(s))  POC Urinalysis Dipstick OB   Collection Time: 06/20/18 11:06 AM  Result Value Ref Range   Color, UA     Clarity, UA     Glucose, UA Negative (A) (none)   Bilirubin, UA     Ketones, UA neg    Spec Grav, UA     Blood, UA neg    pH, UA     POC Protein UA Trace Negative, Trace   Urobilinogen, UA     Nitrite, UA neg    Leukocytes, UA Negative Negative   Appearance     Odor    POCT Wet Prep Mellody Drown([place])   Collection Time: 06/20/18 11:31 AM  Result Value Ref Range   Source Wet Prep POC vaginal    WBC, Wet Prep HPF POC few    Bacteria Wet Prep HPF POC Few Few   BACTERIA WET PREP MORPHOLOGY POC     Clue Cells Wet Prep HPF POC None None   Clue Cells Wet Prep Whiff POC Negative Whiff    Yeast Wet Prep HPF POC Few (A) None   KOH Wet Prep POC     Trichomonas Wet Prep HPF POC Absent Absent    Assessment & Plan:  1) Low-risk pregnancy [id number] at 3289w2d with an Estimated Date of Delivery: 08/20/18   2) Dental abscess, continue antibiotics, keep appt 8/20 for extraction, dental release given  3) Vulvovaginal yeast> otc monistat 7  4) Subutex thearpy: 8mg  TID from Dr. Dillon BjorkPalmer [place]  5) Dep/anx>on zoloft, lamictal and xanax, has slowly weaned xanax to 0.5mg  q hs,  plans to wean to 0.25mg  qhs   Meds: No orders of the defined types were placed in this encounter.  Labs/procedures today: spec exam, wet prep. Declines tdap today, wants next visit  Plan:  Continue routine obstetrical care   Reviewed: Preterm labor symptoms and general obstetric precautions including but not limited to vaginal bleeding, contractions, leaking of fluid and fetal movement were reviewed in detail with the patient.  All questions were answered  Follow-up: Return for asap for pn2 (no visit), then 2wks for LROB.  Orders Placed This Encounter  Procedures  . POC Urinalysis Dipstick OB  . POCT Wet Prep [facility])   Cheral Marker CNM, [facility] 06/20/2018 11:37 AM

## 2018-06-20 NOTE — Patient Instructions (Addendum)
Caitlyn Cummings, I greatly value your feedback.  If you receive a survey following your visit with [place] today, we appreciate you taking the time to fill it out.  Thanks, [name], CNM, WHNP-BC  You can try a 7 night over the counter yeast cream such as Monistat 7. It does not need to be brand name, generic is fine, but please make sure it is a 7 night cream.     Call the office [phone] or go to [facility] if:  You begin to have strong, frequent contractions  Your water breaks.  Sometimes it is a big gush of fluid, sometimes it is just a trickle that keeps getting your panties wet or running down your legs  You have vaginal bleeding.  It is normal to have a small amount of spotting if your cervix was checked.   You don't feel your baby moving like normal.  If you don't, get you something to eat and drink and lay down and focus on feeling your baby move.  You should feel at least 10 movements in 2 hours.  If you don't, you should call the office or go to [facility].    Tdap Vaccine  It is recommended that you get the Tdap vaccine during the third trimester of EACH pregnancy to help protect your baby from getting pertussis (whooping cough)  27-36 weeks is the BEST time to do this so that you can pass the protection on to your baby. During pregnancy is better than after pregnancy, but if you are unable to get it during pregnancy it will be offered at the hospital.   You can get this vaccine with [place], at the health department, your family doctor, or some local pharmacies  Everyone who will be around your baby should also be up-to-date on their vaccines before the baby comes. Adults (who are not pregnant) only need 1 dose of Tdap during adulthood.   Third Trimester of Pregnancy The third trimester is from week 29 through week 42, months 7 through 9. The third trimester is a time when the fetus is growing rapidly. At the end of the ninth month, the fetus is about 20 inches in length  and weighs 6-10 pounds.  BODY CHANGES Your body goes through many changes during pregnancy. The changes vary from woman to woman.   Your weight will continue to increase. You can expect to gain 25-35 pounds (11-16 kg) by the end of the pregnancy.  You may begin to get stretch marks on your hips, abdomen, and breasts.  You may urinate more often because the fetus is moving lower into your pelvis and pressing on your bladder.  You may develop or continue to have heartburn as a result of your pregnancy.  You may develop constipation because certain hormones are causing the muscles that push waste through your intestines to slow down.  You may develop hemorrhoids or swollen, bulging veins (varicose veins).  You may have pelvic pain because of the weight gain and pregnancy hormones relaxing your joints between the bones in your pelvis. Backaches may result from overexertion of the muscles supporting your posture.  You may have changes in your hair. These can include thickening of your hair, rapid growth, and changes in texture. Some women also have hair loss during or after pregnancy, or hair that feels dry or thin. Your hair will most likely return to normal after your baby is born.  Your breasts will continue to grow and be tender.  A yellow discharge may leak from your breasts called colostrum.  Your belly button may stick out.  You may feel short of breath because of your expanding uterus.  You may notice the fetus "dropping," or moving lower in your abdomen.  You may have a bloody mucus discharge. This usually occurs a few days to a week before labor begins.  Your cervix becomes thin and soft (effaced) near your due date. WHAT TO EXPECT AT YOUR PRENATAL EXAMS  You will have prenatal exams every 2 weeks until week 36. Then, you will have weekly prenatal exams. During a routine prenatal visit:  You will be weighed to make sure you and the fetus are growing normally.  Your blood  pressure is taken.  Your abdomen will be measured to track your baby's growth.  The fetal heartbeat will be listened to.  Any test results from the previous visit will be discussed.  You may have a cervical check near your due date to see if you have effaced. At around 36 weeks, your caregiver will check your cervix. At the same time, your caregiver will also perform a test on the secretions of the vaginal tissue. This test is to determine if a type of bacteria, Group B streptococcus, is present. Your caregiver will explain this further. Your caregiver may ask you:  What your birth plan is.  How you are feeling.  If you are feeling the baby move.  If you have had any abnormal symptoms, such as leaking fluid, bleeding, severe headaches, or abdominal cramping.  If you have any questions. Other tests or screenings that may be performed during your third trimester include:  Blood tests that check for low iron levels (anemia).  Fetal testing to check the health, activity level, and growth of the fetus. Testing is done if you have certain medical conditions or if there are problems during the pregnancy. FALSE LABOR You may feel small, irregular contractions that eventually go away. These are called Braxton Hicks contractions, or false labor. Contractions may last for hours, days, or even weeks before true labor sets in. If contractions come at regular intervals, intensify, or become painful, it is best to be seen by your caregiver.  SIGNS OF LABOR   Menstrual-like cramps.  Contractions that are 5 minutes apart or less.  Contractions that start on the top of the uterus and spread down to the lower abdomen and back.  A sense of increased pelvic pressure or back pain.  A watery or bloody mucus discharge that comes from the vagina. If you have any of these signs before the 37th week of pregnancy, call your caregiver right away. You need to go to the hospital to get checked  immediately. HOME CARE INSTRUCTIONS   Avoid all smoking, herbs, alcohol, and unprescribed drugs. These chemicals affect the formation and growth of the baby.  Follow your caregiver's instructions regarding medicine use. There are medicines that are either safe or unsafe to take during pregnancy.  Exercise only as directed by your caregiver. Experiencing uterine cramps is a good sign to stop exercising.  Continue to eat regular, healthy meals.  Wear a good support bra for breast tenderness.  Do not use hot tubs, steam rooms, or saunas.  Wear your seat belt at all times when driving.  Avoid raw meat, uncooked cheese, cat litter boxes, and soil used by cats. These carry germs that can cause birth defects in the baby.  Take your prenatal vitamins.  Try taking a stool  softener (if your caregiver approves) if you develop constipation. Eat more high-fiber foods, such as fresh vegetables or fruit and whole grains. Drink plenty of fluids to keep your urine clear or pale yellow.  Take warm sitz baths to soothe any pain or discomfort caused by hemorrhoids. Use hemorrhoid cream if your caregiver approves.  If you develop varicose veins, wear support hose. Elevate your feet for 15 minutes, 3-4 times a day. Limit salt in your diet.  Avoid heavy lifting, wear low heal shoes, and practice good posture.  Rest a lot with your legs elevated if you have leg cramps or low back pain.  Visit your dentist if you have not gone during your pregnancy. Use a soft toothbrush to brush your teeth and be gentle when you floss.  A sexual relationship may be continued unless your caregiver directs you otherwise.  Do not travel far distances unless it is absolutely necessary and only with the approval of your caregiver.  Take prenatal classes to understand, practice, and ask questions about the labor and delivery.  Make a trial run to the hospital.  Pack your hospital bag.  Prepare the baby's  nursery.  Continue to go to all your prenatal visits as directed by your caregiver. SEEK MEDICAL CARE IF:  You are unsure if you are in labor or if your water has broken.  You have dizziness.  You have mild pelvic cramps, pelvic pressure, or nagging pain in your abdominal area.  You have persistent nausea, vomiting, or diarrhea.  You have a bad smelling vaginal discharge.  You have pain with urination. SEEK IMMEDIATE MEDICAL CARE IF:   You have a fever.  You are leaking fluid from your vagina.  You have spotting or bleeding from your vagina.  You have severe abdominal cramping or pain.  You have rapid weight loss or gain.  You have shortness of breath with chest pain.  You notice sudden or extreme swelling of your face, hands, ankles, feet, or legs.  You have not felt your baby move in over an hour.  You have severe headaches that do not go away with medicine.  You have vision changes. Document Released: 10/24/2001 Document Revised: 11/04/2013 Document Reviewed: 12/31/2012 [facility] Patient Information 2015 [place], [place]. This information is not intended to replace advice given to you by your health care provider. Make sure you discuss any questions you have with your health care provider.

## 2018-07-04 ENCOUNTER — Encounter: Payer: Self-pay | Admitting: Advanced Practice Midwife

## 2018-07-04 ENCOUNTER — Ambulatory Visit (INDEPENDENT_AMBULATORY_CARE_PROVIDER_SITE_OTHER): Payer: Medicaid Other | Admitting: Advanced Practice Midwife

## 2018-07-04 ENCOUNTER — Other Ambulatory Visit: Payer: Self-pay

## 2018-07-04 ENCOUNTER — Other Ambulatory Visit: Payer: Medicaid Other

## 2018-07-04 VITALS — BP 113/67 | HR 74 | Wt 116.0 lb

## 2018-07-04 DIAGNOSIS — Z23 Encounter for immunization: Secondary | ICD-10-CM

## 2018-07-04 DIAGNOSIS — Z3483 Encounter for supervision of other normal pregnancy, third trimester: Secondary | ICD-10-CM

## 2018-07-04 DIAGNOSIS — Z1389 Encounter for screening for other disorder: Secondary | ICD-10-CM

## 2018-07-04 DIAGNOSIS — F1120 Opioid dependence, uncomplicated: Secondary | ICD-10-CM

## 2018-07-04 DIAGNOSIS — Z3A33 33 weeks gestation of pregnancy: Secondary | ICD-10-CM

## 2018-07-04 DIAGNOSIS — O99320 Drug use complicating pregnancy, unspecified trimester: Secondary | ICD-10-CM

## 2018-07-04 DIAGNOSIS — Z331 Pregnant state, incidental: Secondary | ICD-10-CM

## 2018-07-04 LAB — POCT URINALYSIS DIPSTICK OB
Blood, UA: NEGATIVE
Glucose, UA: NEGATIVE — AB
Ketones, UA: NEGATIVE
LEUKOCYTES UA: NEGATIVE
NITRITE UA: NEGATIVE
POC,PROTEIN,UA: NEGATIVE

## 2018-07-04 NOTE — Progress Notes (Signed)
  [id number] 2811w2d Estimated Date of Delivery: 08/20/18  Blood pressure 113/67, pulse 74, weight 116 lb (52.6 kg), last menstrual period 10/27/2017, unknown if currently breastfeeding.    Did not come for PN2, doing it today. Had tooth pulled 8/20.    BP weight and urine results all reviewed and noted.  Please refer to the obstetrical flow sheet for the fundal height and fetal heart rate documentation:  Patient reports good fetal movement, denies any bleeding and no rupture of membranes symptoms or regular contractions. Patient is without complaints. All questions were answered.   Physical Assessment:   Vitals:   07/04/18 0959  BP: 113/67  Pulse: 74  Weight: 116 lb (52.6 kg)  Body mass index is 23.43 kg/m.        Physical Examination:   General appearance: Well appearing, and in no distress  Mental status: Alert, oriented to person, place, and time  Skin: Warm & dry  Cardiovascular: Normal heart rate noted  Respiratory: Normal respiratory effort, no distress  Abdomen: Soft, gravid, nontender  Pelvic: Cervical exam deferred         Extremities: Edema: Trace  Fetal Status: Fetal Heart Rate (bpm): 145 Fundal Height: 32 cm Movement: Present    Results for orders placed or performed in visit on 07/04/18 (from the past 24 hour(s))  POC Urinalysis Dipstick OB   Collection Time: 07/04/18 10:00 AM  Result Value Ref Range   Color, UA     Clarity, UA     Glucose, UA Negative (A) (none)   Bilirubin, UA     Ketones, UA neg    Spec Grav, UA     Blood, UA neg    pH, UA     POC Protein UA Negative Negative, Trace   Urobilinogen, UA     Nitrite, UA neg    Leukocytes, UA Negative Negative   Appearance     Odor       Orders Placed This Encounter  Procedures  . Tdap vaccine greater than or equal to 7yo IM  . POC Urinalysis Dipstick OB    Plan:  Continued routine obstetrical care.. Growth US d/t subutex next visit  ,Return in about 2 weeks (around 07/18/2018) for LROB,  US:EFW.

## 2018-07-04 NOTE — Patient Instructions (Addendum)
Caitlyn Cummings Caitlyn Cummings, I greatly value your feedback.  If you receive a survey following your visit with us today, we appreciate you taking the time to fill it out.  Thanks, Cathie BeamsFran Cresenzo-Dishmon, CNM   Call the office [phone]) or go to [facility] if:  You begin to have strong, frequent contractions  Your water breaks.  Sometimes it is a big gush of fluid, sometimes it is just a trickle that keeps getting your panties wet or running down your legs  You have vaginal bleeding.  It is normal to have a small amount of spotting if your cervix was checked.   You don't feel your baby moving like normal.  If you don't, get you something to eat and drink and lay down and focus on feeling your baby move.  You should feel at least 10 movements in 2 hours.  If you don't, you should call the office or [facility].    Tdap Vaccine  It is recommended that you get the Tdap vaccine during the third trimester of EACH pregnancy to help protect your baby from getting pertussis (whooping cough)  27-36 weeks is the BEST time to do this so that you can pass the protection on to your baby. During pregnancy is better than after pregnancy, but if you are unable to get it during pregnancy it will be offered at the hospital.   You will be offered this vaccine in the office after 27 weeks. If you do not have health insurance, you can get this vaccine at the health department or your family doctor  Everyone who will be around your baby should also be up-to-date on their vaccines. Adults (who are not pregnant) only need 1 dose of Tdap during adulthood.   Third Trimester of Pregnancy The third trimester is from week 29 through week 42, months 7 through 9. The third trimester is a time when the fetus is growing rapidly. At the end of the ninth month, the fetus is about 20 inches in length and weighs 6-10 pounds.  BODY CHANGES Your body goes through many changes during pregnancy. The changes vary from woman to  woman.   Your weight will continue to increase. You can expect to gain 25-35 pounds (11-16 kg) by the end of the pregnancy.  You may begin to get stretch marks on your hips, abdomen, and breasts.  You may urinate more often because the fetus is moving lower into your pelvis and pressing on your bladder.  You may develop or continue to have heartburn as a result of your pregnancy.  You may develop constipation because certain hormones are causing the muscles that push waste through your intestines to slow down.  You may develop hemorrhoids or swollen, bulging veins (varicose veins).  You may have pelvic pain because of the weight gain and pregnancy hormones relaxing your joints between the bones in your pelvis. Backaches may result from overexertion of the muscles supporting your posture.  You may have changes in your hair. These can include thickening of your hair, rapid growth, and changes in texture. Some women also have hair loss during or after pregnancy, or hair that feels dry or thin. Your hair will most likely return to normal after your baby is born.  Your breasts will continue to grow and be tender. A yellow discharge may leak from your breasts called colostrum.  Your belly button may stick out.  You may feel short of breath because of your expanding uterus.  You may notice the fetus "dropping," or moving lower in your abdomen.  You may have a bloody mucus discharge. This usually occurs a few days to a week before labor begins.  Your cervix becomes thin and soft (effaced) near your due date. WHAT TO EXPECT AT YOUR PRENATAL EXAMS  You will have prenatal exams every 2 weeks until week 36. Then, you will have weekly prenatal exams. During a routine prenatal visit:  You will be weighed to make sure you and the fetus are growing normally.  Your blood pressure is taken.  Your abdomen will be measured to track your baby's growth.  The fetal heartbeat will be listened  to.  Any test results from the previous visit will be discussed.  You may have a cervical check near your due date to see if you have effaced. At around 36 weeks, your caregiver will check your cervix. At the same time, your caregiver will also perform a test on the secretions of the vaginal tissue. This test is to determine if a type of bacteria, Group B streptococcus, is present. Your caregiver will explain this further. Your caregiver may ask you:  What your birth plan is.  How you are feeling.  If you are feeling the baby move.  If you have had any abnormal symptoms, such as leaking fluid, bleeding, severe headaches, or abdominal cramping.  If you have any questions. Other tests or screenings that may be performed during your third trimester include:  Blood tests that check for low iron levels (anemia).  Fetal testing to check the health, activity level, and growth of the fetus. Testing is done if you have certain medical conditions or if there are problems during the pregnancy. FALSE LABOR You may feel small, irregular contractions that eventually go away. These are called Braxton Hicks contractions, or false labor. Contractions may last for hours, days, or even weeks before true labor sets in. If contractions come at regular intervals, intensify, or become painful, it is best to be seen by your caregiver.  SIGNS OF LABOR   Menstrual-like cramps.  Contractions that are 5 minutes apart or less.  Contractions that start on the top of the uterus and spread down to the lower abdomen and back.  A sense of increased pelvic pressure or back pain.  A watery or bloody mucus discharge that comes from the vagina. If you have any of these signs before the 37th week of pregnancy, call your caregiver right away. You need to go to the hospital to get checked immediately. HOME CARE INSTRUCTIONS   Avoid all smoking, herbs, alcohol, and unprescribed drugs. These chemicals affect the  formation and growth of the baby.  Follow your caregiver's instructions regarding medicine use. There are medicines that are either safe or unsafe to take during pregnancy.  Exercise only as directed by your caregiver. Experiencing uterine cramps is a good sign to stop exercising.  Continue to eat regular, healthy meals.  Wear a good support bra for breast tenderness.  Do not use hot tubs, steam rooms, or saunas.  Wear your seat belt at all times when driving.  Avoid raw meat, uncooked cheese, cat litter boxes, and soil used by cats. These carry germs that can cause birth defects in the baby.  Take your prenatal vitamins.  Try taking a stool softener (if your caregiver approves) if you develop constipation. Eat more high-fiber foods, such as fresh vegetables or fruit and whole grains. Drink plenty of fluids to keep your urine  clear or pale yellow.  Take warm sitz baths to soothe any pain or discomfort caused by hemorrhoids. Use hemorrhoid cream if your caregiver approves.  If you develop varicose veins, wear support hose. Elevate your feet for 15 minutes, 3-4 times a day. Limit salt in your diet.  Avoid heavy lifting, wear low heal shoes, and practice good posture.  Rest a lot with your legs elevated if you have leg cramps or low back pain.  Visit your dentist if you have not gone during your pregnancy. Use a soft toothbrush to brush your teeth and be gentle when you floss.  A sexual relationship may be continued unless your caregiver directs you otherwise.  Do not travel far distances unless it is absolutely necessary and only with the approval of your caregiver.  Take prenatal classes to understand, practice, and ask questions about the labor and delivery.  Make a trial run to the hospital.  Pack your hospital bag.  Prepare the baby's nursery.  Continue to go to all your prenatal visits as directed by your caregiver. SEEK MEDICAL CARE IF:  You are unsure if you are in  labor or if your water has broken.  You have dizziness.  You have mild pelvic cramps, pelvic pressure, or nagging pain in your abdominal area.  You have persistent nausea, vomiting, or diarrhea.  You have a bad smelling vaginal discharge.  You have pain with urination. SEEK IMMEDIATE MEDICAL CARE IF:   You have a fever.  You are leaking fluid from your vagina.  You have spotting or bleeding from your vagina.  You have severe abdominal cramping or pain.  You have rapid weight loss or gain.  You have shortness of breath with chest pain.  You notice sudden or extreme swelling of your face, hands, ankles, feet, or legs.  You have not felt your baby move in over an hour.  You have severe headaches that do not go away with medicine.  You have vision changes. Document Released: 10/24/2001 Document Revised: 11/04/2013 Document Reviewed: 12/31/2012 [facility] Patient Information 2015 [place], [place]. This information is not intended to replace advice given to you by your health care provider. Make sure you discuss any questions you have with your health care provider.

## 2018-07-05 LAB — GLUCOSE TOLERANCE, 2 HOURS W/ 1HR
GLUCOSE, 1 HOUR: 85 mg/dL (ref 65–179)
GLUCOSE, FASTING: 72 mg/dL (ref 65–91)
Glucose, 2 hour: 72 mg/dL (ref 65–152)

## 2018-07-05 LAB — CBC
HEMOGLOBIN: 11.2 g/dL (ref 11.1–15.9)
Hematocrit: 33.3 % — ABNORMAL LOW (ref 34.0–46.6)
MCH: 30.1 pg (ref 26.6–33.0)
MCHC: 33.6 g/dL (ref 31.5–35.7)
MCV: 90 fL (ref 79–97)
PLATELETS: 296 10*3/uL (ref 150–450)
RBC: 3.72 x10E6/uL — AB (ref 3.77–5.28)
RDW: 15 % (ref 12.3–15.4)
WBC: 12.4 10*3/uL — ABNORMAL HIGH (ref 3.4–10.8)

## 2018-07-05 LAB — RPR: RPR: NONREACTIVE

## 2018-07-05 LAB — ANTIBODY SCREEN: ANTIBODY SCREEN: NEGATIVE

## 2018-07-05 LAB — HIV ANTIBODY (ROUTINE TESTING W REFLEX): HIV SCREEN 4TH GENERATION: NONREACTIVE

## 2018-07-17 ENCOUNTER — Other Ambulatory Visit: Payer: Medicaid Other

## 2018-07-17 ENCOUNTER — Encounter: Payer: Medicaid Other | Admitting: Women's Health

## 2018-07-26 ENCOUNTER — Encounter: Payer: Self-pay | Admitting: Obstetrics and Gynecology

## 2018-07-26 ENCOUNTER — Ambulatory Visit (INDEPENDENT_AMBULATORY_CARE_PROVIDER_SITE_OTHER): Payer: Medicaid Other | Admitting: Obstetrics and Gynecology

## 2018-07-26 VITALS — BP 97/58 | HR 71 | Wt 120.0 lb

## 2018-07-26 DIAGNOSIS — Z331 Pregnant state, incidental: Secondary | ICD-10-CM

## 2018-07-26 DIAGNOSIS — Z1389 Encounter for screening for other disorder: Secondary | ICD-10-CM

## 2018-07-26 DIAGNOSIS — Z3483 Encounter for supervision of other normal pregnancy, third trimester: Secondary | ICD-10-CM

## 2018-07-26 DIAGNOSIS — Z3A36 36 weeks gestation of pregnancy: Secondary | ICD-10-CM

## 2018-07-26 LAB — POCT URINALYSIS DIPSTICK OB
Blood, UA: NEGATIVE
Glucose, UA: NEGATIVE
Ketones, UA: NEGATIVE
LEUKOCYTES UA: NEGATIVE
Nitrite, UA: NEGATIVE
POC,PROTEIN,UA: NEGATIVE

## 2018-07-26 NOTE — Progress Notes (Signed)
Patient ID: Caitlyn Cummings, female   DOB: 1981-01-11, 37 y.o.   MRN: [id number]    LOW-RISK PREGNANCY VISIT Patient name: Caitlyn Manlyshley N Poznanski MRN [id number]  Date of birth: 1981-01-11 Chief Complaint:   Routine Prenatal Visit (gbs-gc-chl)  History of Present Illness:   Caitlyn Manlyshley N Brugh is a 37 y.o. [id number] female at 4221w3d with an Estimated Date of Delivery: 08/20/18 being seen today for ongoing management of a low-risk pregnancy. Has signed Tubal papers for in hosp BTL. Denies constipations. GBS  were negative in past pregnancies. Today she reports no complaints. Contractions: Irregular.  .  Movement: Present. denies leaking of fluid. Review of Systems:   Pertinent items are noted in HPI Denies abnormal vaginal discharge w/ itching/odor/irritation, headaches, visual changes, shortness of breath, chest pain, abdominal pain, severe nausea/vomiting, or problems with urination or bowel movements unless otherwise stated above. Pertinent History Reviewed:  Reviewed past medical,surgical, social, obstetrical and family history.  Reviewed problem list, medications and allergies. Physical Assessment:   Vitals:   07/26/18 1011  BP: (!) 97/58  Pulse: 71  Weight: 120 lb (54.4 kg)  Body mass index is 24.24 kg/m.        Physical Examination:   General appearance: Well appearing, and in no distress  Mental status: Alert, oriented to person, place, and time  Skin: Warm & dry  Cardiovascular: Normal heart rate noted  Respiratory: Normal respiratory effort, no distress  Abdomen: Soft, gravid, nontender  Pelvic: Cervical exam performed 1 cm, soft        Extremities: Edema: Trace  Fetal Status: Fetal Heart Rate (bpm): 145 Fundal Height: 34 cm Movement: Present    Results for orders placed or performed in visit on 07/26/18 (from the past 24 hour(s))  POC Urinalysis Dipstick OB   Collection Time: 07/26/18 10:14 AM  Result Value Ref Range   Color, UA     Clarity, UA     Glucose, UA Negative Negative   Bilirubin, UA     Ketones, UA neg    Spec Grav, UA     Blood, UA neg    pH, UA     POC Protein UA Negative Negative, Trace   Urobilinogen, UA     Nitrite, UA neg    Leukocytes, UA Negative Negative   Appearance     Odor      Assessment & Plan:  1) Low-risk pregnancy [id number] at 321w3d with an Estimated Date of Delivery: 08/20/18     Meds: No orders of the defined types were placed in this encounter.  Labs/procedures today: GBS, GC/CHL  Plan:  1. Continue routine obstetrical care  2. F/u in 1 week for LROB  Follow-up: Return in about 1 week (around 08/02/2018) for LROB.  Orders Placed This Encounter  Procedures  . Strep Gp B NAA  . GC/Chlamydia Probe Amp  . POC Urinalysis Dipstick OB   By signing my name below, I, Arnette NorrisMari Johnson, attest that this documentation has been prepared under the direction and in the presence of Tilda BurrowFerguson, Maryiah Olvey V, MD. Electronically Signed: Arnette NorrisMari Johnson Medical Scribe. 07/26/18. 10:25 AM.  I personally performed the services described in this documentation, which was SCRIBED in my presence. The recorded information has been reviewed and considered accurate. It has been edited as necessary during review. Tilda BurrowJohn V Devontaye Ground, MD

## 2018-07-28 LAB — STREP GP B NAA: Strep Gp B NAA: NEGATIVE

## 2018-07-30 LAB — GC/CHLAMYDIA PROBE AMP
CHLAMYDIA, DNA PROBE: NEGATIVE
NEISSERIA GONORRHOEAE BY PCR: NEGATIVE

## 2018-08-07 ENCOUNTER — Encounter: Payer: Self-pay | Admitting: Advanced Practice Midwife

## 2018-08-07 ENCOUNTER — Ambulatory Visit (INDEPENDENT_AMBULATORY_CARE_PROVIDER_SITE_OTHER): Payer: Medicaid Other | Admitting: Advanced Practice Midwife

## 2018-08-07 VITALS — BP 124/69 | HR 74 | Wt 119.0 lb

## 2018-08-07 DIAGNOSIS — O26893 Other specified pregnancy related conditions, third trimester: Secondary | ICD-10-CM

## 2018-08-07 DIAGNOSIS — N898 Other specified noninflammatory disorders of vagina: Secondary | ICD-10-CM | POA: Diagnosis not present

## 2018-08-07 DIAGNOSIS — Z1389 Encounter for screening for other disorder: Secondary | ICD-10-CM

## 2018-08-07 DIAGNOSIS — Z3483 Encounter for supervision of other normal pregnancy, third trimester: Secondary | ICD-10-CM

## 2018-08-07 DIAGNOSIS — Z23 Encounter for immunization: Secondary | ICD-10-CM

## 2018-08-07 DIAGNOSIS — Z3A38 38 weeks gestation of pregnancy: Secondary | ICD-10-CM

## 2018-08-07 DIAGNOSIS — Z331 Pregnant state, incidental: Secondary | ICD-10-CM

## 2018-08-07 LAB — POCT URINALYSIS DIPSTICK OB
GLUCOSE, UA: NEGATIVE
Ketones, UA: NEGATIVE
Nitrite, UA: NEGATIVE
PROTEIN: NEGATIVE

## 2018-08-07 MED ORDER — METRONIDAZOLE 500 MG PO TABS: 500.0000 mg | ORAL_TABLET | Freq: Two times a day (BID) | ORAL | 0 refills | Status: AC

## 2018-08-07 NOTE — Patient Instructions (Signed)

## 2018-08-07 NOTE — Progress Notes (Signed)
White, watery discharge with no odor.

## 2018-08-07 NOTE — Progress Notes (Signed)
  [id number] Estimated Date of Delivery: 08/20/18  Blood pressure 124/69, pulse 74, weight 119 lb (54 kg), last menstrual period 10/27/2017, unknown if currently breastfeeding.   BP weight and urine results all reviewed and noted.  Please refer to the obstetrical flow sheet for the fundal height and fetal heart rate documentation:  Patient reports good fetal movement, denies any bleeding and no rupture of membranes symptoms or regular contractions. Patient has some vag itch. Thin white dc, no odor. All questions were answered.   Physical Assessment:   Vitals:   08/07/18 1053  BP: 124/69  Pulse: 74  Weight: 119 lb (54 kg)  Body mass index is 24.04 kg/m.        Physical Examination:   General appearance: Well appearing, and in no distress  Mental status: Alert, oriented to person, place, and time  Skin: Warm & dry  Cardiovascular: Normal heart rate noted  Respiratory: Normal respiratory effort, no distress  Abdomen: Soft, gravid, nontender  Pelvic: Cervical exam performed         Extremities: Edema: Trace  Fetal Status:     Movement: Present    Results for orders placed or performed in visit on 08/07/18 (from the past 24 hour(s))  POC Urinalysis Dipstick OB   Collection Time: 08/07/18 10:54 AM  Result Value Ref Range   Color, UA     Clarity, UA     Glucose, UA Negative Negative   Bilirubin, UA     Ketones, UA neg    Spec Grav, UA     Blood, UA 1+    pH, UA     POC Protein UA Negative Negative, Trace   Urobilinogen, UA     Nitrite, UA neg    Leukocytes, UA Trace (A) Negative   Appearance     Odor       Orders Placed This Encounter  Procedures  . Flu Vaccine QUAD 36+ mos IM (Fluarix, Quad PF)  . POC Urinalysis Dipstick OB    Plan:  Continued routine obstetrical care, wet prep few clue no trich seen   Return in about 1 week (around 08/14/2018) for LROB; tomoroow for EFW US only.

## 2018-08-08 ENCOUNTER — Ambulatory Visit (INDEPENDENT_AMBULATORY_CARE_PROVIDER_SITE_OTHER): Payer: Medicaid Other

## 2018-08-08 DIAGNOSIS — F1120 Opioid dependence, uncomplicated: Secondary | ICD-10-CM | POA: Diagnosis not present

## 2018-08-08 DIAGNOSIS — O99320 Drug use complicating pregnancy, unspecified trimester: Secondary | ICD-10-CM | POA: Diagnosis not present

## 2018-08-08 DIAGNOSIS — Z3403 Encounter for supervision of normal first pregnancy, third trimester: Secondary | ICD-10-CM

## 2018-08-08 NOTE — Progress Notes (Signed)
[place] 38+2 wks,cephalic,posterior pl gr 3,afi 13 cm,fhr 129 bpm,efw 3004 g 25%

## 2018-08-09 LAB — TRICHOMONAS VAGINALIS, PROBE AMP: TRICH VAG BY NAA: NEGATIVE

## 2018-08-16 ENCOUNTER — Encounter: Payer: Self-pay | Admitting: Women's Health

## 2018-08-16 ENCOUNTER — Ambulatory Visit (INDEPENDENT_AMBULATORY_CARE_PROVIDER_SITE_OTHER): Payer: Medicaid Other | Admitting: Women's Health

## 2018-08-16 VITALS — BP 97/57 | HR 95 | Wt 116.6 lb

## 2018-08-16 DIAGNOSIS — Z3483 Encounter for supervision of other normal pregnancy, third trimester: Secondary | ICD-10-CM | POA: Diagnosis not present

## 2018-08-16 DIAGNOSIS — Z331 Pregnant state, incidental: Secondary | ICD-10-CM

## 2018-08-16 DIAGNOSIS — Z3A39 39 weeks gestation of pregnancy: Secondary | ICD-10-CM

## 2018-08-16 DIAGNOSIS — Z1389 Encounter for screening for other disorder: Secondary | ICD-10-CM

## 2018-08-16 DIAGNOSIS — O480 Post-term pregnancy: Secondary | ICD-10-CM

## 2018-08-16 LAB — POCT URINALYSIS DIPSTICK OB
Glucose, UA: NEGATIVE
Ketones, UA: NEGATIVE
LEUKOCYTES UA: NEGATIVE
NITRITE UA: NEGATIVE
POC,PROTEIN,UA: NEGATIVE
RBC UA: NEGATIVE

## 2018-08-16 NOTE — Progress Notes (Signed)
   LOW-RISK PREGNANCY VISIT Patient name: Caitlyn Cummings MRN [id number]  Date of birth: 1981-01-12 Chief Complaint:   Routine Prenatal Visit  History of Present Illness:   Caitlyn Cummings is a 37 y.o. [id number] female at [redacted]w[redacted]d with an Estimated Date of Delivery: 08/20/18 being seen today for ongoing management of a low-risk pregnancy.  Today she reports no complaints. Contractions: Irregular.  .  Movement: Present. denies leaking of fluid. Review of Systems:   Pertinent items are noted in HPI Denies abnormal vaginal discharge w/ itching/odor/irritation, headaches, visual changes, shortness of breath, chest pain, abdominal pain, severe nausea/vomiting, or problems with urination or bowel movements unless otherwise stated above. Pertinent History Reviewed:  Reviewed past medical,surgical, social, obstetrical and family history.  Reviewed problem list, medications and allergies. Physical Assessment:   Vitals:   08/16/18 1037  BP: (!) 97/57  Pulse: 95  Weight: 116 lb 9.6 oz (52.9 kg)  Body mass index is 23.55 kg/m.        Physical Examination:   General appearance: Well appearing, and in no distress  Mental status: Alert, oriented to person, place, and time  Skin: Warm & dry  Cardiovascular: Normal heart rate noted  Respiratory: Normal respiratory effort, no distress  Abdomen: Soft, gravid, nontender  Pelvic: Cervical exam performed  Dilation: 4 Effacement (%): 50 Station: -2 Offered membrane sweeping, discussed r/b- pt decided to proceed, so membranes swept.   Extremities: Edema: Trace  Fetal Status: Fetal Heart Rate (bpm): 128 Fundal Height: 37 cm Movement: Present Presentation: Vertex  Results for orders placed or performed in visit on 08/16/18 (from the past 24 hour(s))  POC Urinalysis Dipstick OB   Collection Time: 08/16/18 10:45 AM  Result Value Ref Range   Color, UA     Clarity, UA     Glucose, UA Negative Negative   Bilirubin, UA     Ketones, UA neg    Spec Grav, UA     Blood, UA neg    pH, UA     POC Protein UA Negative Negative, Trace   Urobilinogen, UA     Nitrite, UA neg    Leukocytes, UA Negative Negative   Appearance     Odor      Assessment & Plan:  1) Low-risk pregnancy [id number] at [redacted]w[redacted]d with an Estimated Date of Delivery: 08/20/18   2) Subutex therapy, 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  Labs/procedures today: sve, membrane sweeping  Plan:  Continue routine obstetrical care   Reviewed: Term labor symptoms and general obstetric precautions including but not limited to vaginal bleeding, contractions, leaking of fluid and fetal movement were reviewed in detail with the patient.  All questions were answered  Follow-up: Return in about 1 week (around 08/23/2018) for LROB, US:BPP.  Orders Placed This Encounter  Procedures  . US FETAL BPP WO NON STRESS  . POC Urinalysis Dipstick OB   Cheral Marker CNM, [facility] 08/16/2018 11:11 AM

## 2018-08-16 NOTE — Patient Instructions (Signed)
Caitlyn Cummings, I greatly value your feedback.  If you receive a survey following your visit with [place] today, we appreciate you taking the time to fill it out.  Thanks, [name], CNM, WHNP-BC   Call the office [phone]) or go to [facility] if:  You begin to have strong, frequent contractions  Your water breaks.  Sometimes it is a big gush of fluid, sometimes it is just a trickle that keeps getting your panties wet or running down your legs  You have vaginal bleeding.  It is normal to have a small amount of spotting if your cervix was checked.   You don't feel your baby moving like normal.  If you don't, get you something to eat and drink and lay down and focus on feeling your baby move.  You should feel at least 10 movements in 2 hours.  If you don't, you should call the office or go to [facility].   [facility] Contractions Contractions of the uterus can occur throughout pregnancy, but they are not always a sign that you are in labor. You may have practice contractions called Braxton Hicks contractions. These false labor contractions are sometimes confused with true labor. What are Deberah Pelton contractions? Braxton Hicks contractions are tightening movements that occur in the muscles of the uterus before labor. Unlike true labor contractions, these contractions do not result in opening (dilation) and thinning of the cervix. Toward the end of pregnancy (32-34 weeks), Braxton Hicks contractions can happen more often and may become stronger. These contractions are sometimes difficult to tell apart from true labor because they can be very uncomfortable. You should not feel embarrassed if you go to the hospital with false labor. Sometimes, the only way to tell if you are in true labor is for your health care provider to look for changes in the cervix. The health care provider will do a physical exam and may monitor your contractions. If you are not in true labor, the exam should show  that your cervix is not dilating and your water has not broken. If there are other health problems associated with your pregnancy, it is completely safe for you to be sent home with false labor. You may continue to have Braxton Hicks contractions until you go into true labor. How to tell the difference between true labor and false labor True labor  Contractions last 30-70 seconds.  Contractions become very regular.  Discomfort is usually felt in the top of the uterus, and it spreads to the lower abdomen and low back.  Contractions do not go away with walking.  Contractions usually become more intense and increase in frequency.  The cervix dilates and gets thinner. False labor  Contractions are usually shorter and not as strong as true labor contractions.  Contractions are usually irregular.  Contractions are often felt in the front of the lower abdomen and in the groin.  Contractions may go away when you walk around or change positions while lying down.  Contractions get weaker and are shorter-lasting as time goes on.  The cervix usually does not dilate or become thin. Follow these instructions at home:  Take over-the-counter and prescription medicines only as told by your health care provider.  Keep up with your usual exercises and follow other instructions from your health care provider.  Eat and drink lightly if you think you are going into labor.  If Braxton Hicks contractions are making you uncomfortable: ? Change your position from lying down or  resting to walking, or change from walking to resting. ? Sit and rest in a tub of warm water. ? Drink enough fluid to keep your urine pale yellow. Dehydration may cause these contractions. ? Do slow and deep breathing several times an hour.  Keep all follow-up prenatal visits as told by your health care provider. This is important. Contact a health care provider if:  You have a fever.  You have continuous pain in your  abdomen. Get help right away if:  Your contractions become stronger, more regular, and closer together.  You have fluid leaking or gushing from your vagina.  You pass blood-tinged mucus (bloody show).  You have bleeding from your vagina.  You have low back pain that you never had before.  You feel your baby's head pushing down and causing pelvic pressure.  Your baby is not moving inside you as much as it used to. Summary  Contractions that occur before labor are called Braxton Hicks contractions, false labor, or practice contractions.  Braxton Hicks contractions are usually shorter, weaker, farther apart, and less regular than true labor contractions. True labor contractions usually become progressively stronger and regular and they become more frequent.  Manage discomfort from [facility] contractions by changing position, resting in a warm bath, drinking plenty of water, or practicing deep breathing. This information is not intended to replace advice given to you by your health care provider. Make sure you discuss any questions you have with your health care provider. Document Released: 03/15/2017 Document Revised: 03/15/2017 Document Reviewed: 03/15/2017 Elsevier Interactive Patient Education  2018 [employer]

## 2018-08-23 ENCOUNTER — Encounter: Payer: Medicaid Other | Admitting: Women's Health

## 2018-08-23 ENCOUNTER — Encounter: Payer: Self-pay | Admitting: *Deleted

## 2018-08-23 ENCOUNTER — Other Ambulatory Visit: Payer: Medicaid Other

## 2018-08-29 ENCOUNTER — Telehealth: Payer: Self-pay | Admitting: *Deleted

## 2018-08-29 MED ORDER — SERTRALINE HCL 100 MG PO TABS: 100.0000 mg | ORAL_TABLET | Freq: Every day | ORAL | 3 refills | Status: AC

## 2018-08-29 NOTE — Telephone Encounter (Signed)
Unable to reach patient.

## 2018-09-27 ENCOUNTER — Encounter: Payer: Self-pay | Admitting: *Deleted

## 2018-09-27 ENCOUNTER — Ambulatory Visit: Payer: Medicaid Other | Admitting: Women's Health

## 2018-11-12 ENCOUNTER — Encounter (HOSPITAL_COMMUNITY): Payer: Self-pay
